# Patient Record
Sex: Female | Born: 1950 | Race: Black or African American | Hispanic: No | Smoking: Current every day smoker
Health system: Southern US, Community
[De-identification: ages and names within clinical notes are randomized; demographics above are authoritative.]

## PROBLEM LIST (undated history)

## (undated) DIAGNOSIS — K219 Gastro-esophageal reflux disease without esophagitis: Secondary | ICD-10-CM

## (undated) DIAGNOSIS — I10 Essential (primary) hypertension: Secondary | ICD-10-CM

## (undated) DIAGNOSIS — F419 Anxiety disorder, unspecified: Secondary | ICD-10-CM

## (undated) DIAGNOSIS — E785 Hyperlipidemia, unspecified: Secondary | ICD-10-CM

## (undated) DIAGNOSIS — F329 Major depressive disorder, single episode, unspecified: Secondary | ICD-10-CM

## (undated) DIAGNOSIS — F32A Depression, unspecified: Secondary | ICD-10-CM

## (undated) HISTORY — PX: TENDON REPAIR: SHX5111

## (undated) HISTORY — DX: Hyperlipidemia, unspecified: E78.5

## (undated) HISTORY — PX: ACHILLES TENDON REPAIR: SUR1153

## (undated) HISTORY — PX: ABDOMINAL HYSTERECTOMY: SHX81

## (undated) HISTORY — PX: TOTAL ABDOMINAL HYSTERECTOMY W/ BILATERAL SALPINGOOPHORECTOMY: SHX83

---

## 2003-10-30 ENCOUNTER — Other Ambulatory Visit: Payer: Self-pay

## 2004-08-26 ENCOUNTER — Other Ambulatory Visit: Payer: Self-pay

## 2004-08-26 ENCOUNTER — Emergency Department: Payer: Self-pay | Admitting: General Practice

## 2004-12-30 ENCOUNTER — Emergency Department: Payer: Self-pay | Admitting: Internal Medicine

## 2005-05-08 ENCOUNTER — Emergency Department: Payer: Self-pay | Admitting: Internal Medicine

## 2005-05-14 ENCOUNTER — Emergency Department: Payer: Self-pay | Admitting: Unknown Physician Specialty

## 2005-05-15 ENCOUNTER — Emergency Department: Payer: Self-pay | Admitting: General Practice

## 2006-03-01 ENCOUNTER — Emergency Department: Payer: Self-pay | Admitting: Internal Medicine

## 2006-05-20 ENCOUNTER — Emergency Department: Payer: Self-pay | Admitting: Emergency Medicine

## 2007-11-24 ENCOUNTER — Ambulatory Visit: Payer: Self-pay | Admitting: Internal Medicine

## 2008-04-27 ENCOUNTER — Emergency Department: Payer: Self-pay | Admitting: Emergency Medicine

## 2008-04-27 ENCOUNTER — Other Ambulatory Visit: Payer: Self-pay

## 2008-12-31 ENCOUNTER — Emergency Department: Payer: Self-pay | Admitting: Emergency Medicine

## 2010-12-10 ENCOUNTER — Emergency Department: Payer: Self-pay | Admitting: Emergency Medicine

## 2012-05-05 ENCOUNTER — Observation Stay: Payer: Self-pay | Admitting: Specialist

## 2012-05-05 LAB — CBC
HCT: 40.1 % (ref 35.0–47.0)
MCH: 31.1 pg (ref 26.0–34.0)
MCV: 95 fL (ref 80–100)
RDW: 14.1 % (ref 11.5–14.5)
WBC: 10.6 10*3/uL (ref 3.6–11.0)

## 2012-05-05 LAB — BASIC METABOLIC PANEL
Anion Gap: 8 (ref 7–16)
BUN: 12 mg/dL (ref 7–18)
Calcium, Total: 9.2 mg/dL (ref 8.5–10.1)
Chloride: 110 mmol/L — ABNORMAL HIGH (ref 98–107)
Co2: 26 mmol/L (ref 21–32)
Creatinine: 0.93 mg/dL (ref 0.60–1.30)
Glucose: 108 mg/dL — ABNORMAL HIGH (ref 65–99)
Potassium: 3.2 mmol/L — ABNORMAL LOW (ref 3.5–5.1)

## 2012-05-05 LAB — TROPONIN I: Troponin-I: 0.02 ng/mL

## 2012-05-05 LAB — CK TOTAL AND CKMB (NOT AT ARMC)
CK, Total: 116 U/L (ref 21–215)
CK-MB: 0.9 ng/mL (ref 0.5–3.6)

## 2012-05-06 LAB — LIPID PANEL
Cholesterol: 218 mg/dL — ABNORMAL HIGH (ref 0–200)
Triglycerides: 75 mg/dL (ref 0–200)
VLDL Cholesterol, Calc: 15 mg/dL (ref 5–40)

## 2012-05-06 LAB — POTASSIUM: Potassium: 3.9 mmol/L (ref 3.5–5.1)

## 2013-04-25 ENCOUNTER — Emergency Department: Payer: Self-pay | Admitting: Emergency Medicine

## 2014-07-20 ENCOUNTER — Emergency Department: Payer: Self-pay | Admitting: Emergency Medicine

## 2014-08-31 ENCOUNTER — Ambulatory Visit: Payer: Self-pay | Admitting: Internal Medicine

## 2014-11-20 DIAGNOSIS — E669 Obesity, unspecified: Secondary | ICD-10-CM | POA: Diagnosis not present

## 2014-11-20 DIAGNOSIS — Z1389 Encounter for screening for other disorder: Secondary | ICD-10-CM | POA: Diagnosis not present

## 2014-11-20 DIAGNOSIS — I10 Essential (primary) hypertension: Secondary | ICD-10-CM | POA: Diagnosis not present

## 2014-12-06 DIAGNOSIS — H3532 Exudative age-related macular degeneration: Secondary | ICD-10-CM | POA: Diagnosis not present

## 2015-01-19 DIAGNOSIS — I10 Essential (primary) hypertension: Secondary | ICD-10-CM | POA: Diagnosis not present

## 2015-01-19 DIAGNOSIS — E669 Obesity, unspecified: Secondary | ICD-10-CM | POA: Diagnosis not present

## 2015-02-28 DIAGNOSIS — H3532 Exudative age-related macular degeneration: Secondary | ICD-10-CM | POA: Diagnosis not present

## 2015-03-04 NOTE — Discharge Summary (Signed)
PATIENT NAME:  Carmen Buckley, Carmen Buckley MR#:  [id number] DATE OF BIRTH:  07/27/1951  DATE OF ADMISSION:  05/05/2012 DATE OF DISCHARGE:  05/06/2012  HISTORY: For a detailed note, please take a look at the history and physical done on admission.   DISCHARGE DIAGNOSES:  1. Chest pain, likely related to anxiety or musculoskeletal in nature.  2. Hypertension, newly diagnosed.  3. Hyperlipidemia.  4. History of tobacco abuse.   DIET: The patient was discharged on a low sodium diet.   ACTIVITY: As tolerated.   FOLLOW-UP: The patient is supposed to get herself a primary care physician and she has insurance.   DISCHARGE MEDICATIONS:  1. Lisinopril 10 mg daily.  2. Pravachol 20 mg daily.  3. Aspirin 81 mg daily.   PERTINENT STUDIES DONE DURING THE HOSPITAL COURSE: Chest x-ray done on admission showing no acute cardiopulmonary disease. A nuclear medicine stress test done the day after admission showing no evidence of myocardial ischemia. Normal LV function, no ischemia.   HOSPITAL COURSE: This is a 64 year old female with medical problems as mentioned above who presented to the hospital with chest pain and palpitations.  1. Chest pain. Given the patient's history of undiagnosed hypertension, ongoing tobacco abuse and some family history, she was brought into the hospital, observed overnight on telemetry, had three sets of cardiac markers checked which were negative. She underwent a stress test the day after admission which showed no evidence of any acute myocardial ischemia. She was, therefore, discharged home on some maintenance medications including aspirin and statin. The patient's total cholesterol was noted to be mildly elevated which is why she was started on a statin.  2. Hypertension. The patient has no previous history of hypertension. When she presented, her systolic blood pressures were over 170 and she had some intermittent stage I hypertension. She was therefore discharged on a low-dose ACE  inhibitor. She was started on a beta blocker while she was in the hospital, but she was noted to be bradycardic. Therefore, I took her off her beta blocker for now.  3. Hypokalemia. This was supplemented and since then has resolved.  4. Tobacco abuse. The patient was strongly advised to quit smoking and placed on a nicotine patch when in the hospital.  5. Hyperlipidemia. As mentioned, the patient's total cholesterol was noted to be as high as 200 with LDL over 150. She was, therefore, started on low dose statin.   CODE STATUS: The patient is a FULL CODE.   TIME SPENT: 40 minutes.   ____________________________ Belia Heman. [name], MD vjs:ap D: 05/07/2012 21:19:41 ET T: 05/07/2012 13:34:53 ET JOB#: [id number]  cc: Belia Heman. [name], MD, <Dictator> Henreitta Leber MD ELECTRONICALLY SIGNED 05/19/2012 12:24

## 2015-03-04 NOTE — H&P (Signed)
PATIENT NAME:  Carmen Buckley, GOAR MR#:  [id number] DATE OF BIRTH:  01-20-1951  DATE OF ADMISSION:  05/05/2012  PRIMARY CARE PHYSICIAN: The patient does not have one.  CHIEF COMPLAINT: Chest pain/pressure and palpitations.   HISTORY OF PRESENT ILLNESS: This is a 64 year old female who presents to the Emergency Room complaining of palpitations and intermittent chest tightness ongoing for the past two days. The patient thought it was related to her reflux, but she tried taking some over-the-counter reflux medications, but they did not alleviate her symptoms. She therefore came to the ER for further evaluation. The patient said that she also developed some shortness of breath and some dizziness along with these symptoms. She has never had these symptoms before. The patient does not have a primary care physician. She does have a strong family history of coronary artery disease and has ongoing tobacco abuse. Hospitalist services were contacted for further treatment and evaluation.   REVIEW OF SYSTEMS: CONSTITUTIONAL: No documented fever. No weight gain or weight loss. EYES: No blurred or double vision. ENT: No tinnitus. No postnasal drip. No redness of the oropharynx. RESPIRATORY: No cough, no wheeze, and no hemoptysis. CARDIOVASCULAR: Positive chest pain. No orthopnea. Positive palpitations. No syncope. GASTROINTESTINAL: No nausea, no vomiting, no diarrhea, no abdominal pain, and no melena or hematochezia. GU: No dysuria or hematuria. ENDOCRINE: No polyuria or nocturia. No heat or cold intolerance. HEMATOLOGIC: No anemia, no bruising, and no bleeding. INTEGUMENTARY: No rashes. No lesions. MUSCULOSKELETAL: No arthritis, no swelling, and no gout. NEUROLOGIC: No numbness, no tingling, no ataxia, and no seizure-type activity. PSYCH: No anxiety, no insomnia, and no ADD.   PAST MEDICAL HISTORY:  1. Macular degeneration. 2. Ongoing tobacco use.   ALLERGIES: No known drug allergies.   SOCIAL HISTORY: She does smoke  a few cigarettes a day and has been smoking for the last 30 to 40 years. No alcohol abuse. No illicit drug abuse. She lives at home by herself.   FAMILY HISTORY: Significant for diabetes on the mother's side of her family. Her mother died from complications of heart failure, father died from pneumonia and respiratory illness.   CURRENT MEDICATIONS: She is currently on no medication.  ADMISSION PHYSICAL EXAMINATION:   VITAL SIGNS: Temperature 98, pulse 68, respirations 18, blood pressure 132/87, and saturation 97% on room air.   GENERAL: The patient is a pleasant appearing female in no apparent distress.   HEENT: Atraumatic, normocephalic. Extraocular muscles are intact. Pupils are equal and reactive to light. Sclerae anicteric. No conjunctival injection. No pharyngeal erythema.   NECK: Supple. No jugular distention. No bruits, no lymphadenopathy, and no thyromegaly.   HEART: Regular rate and rhythm. No murmurs, rubs, or clicks.   LUNGS: Clear to auscultation bilaterally. No rales, no rhonchi, and no wheezes.   ABDOMEN: Soft, flat, nontender, and nondistended. Has good bowel sounds. No hepatosplenomegaly appreciated.   EXTREMITIES: No evidence of any cyanosis, clubbing, or peripheral edema. Has +2 pedal and radial pulses bilaterally.   NEUROLOGICAL: The patient is alert, awake, and oriented x3 with no focal motor or sensory deficits appreciated bilaterally.   SKIN: Moist and warm with no rash appreciated.   LYMPHATIC: There is no cervical or axillary lymphadenopathy.   LABORATORY AND DIAGNOSTICS: Serum glucose 108, BUN 12, creatinine 0.9, sodium 144, potassium 3.2, chloride 110, and bicarbonate 26. LFTs are within normal limits. White cell count is 10.6, hemoglobin 13.2, hematocrit 40.1, and platelet count 201. CK 122 and troponin less than 0.02.   EKG: Normal  sinus rhythm with normal axis and no evidence of any ST or T wave changes.   ASSESSMENT AND PLAN: This is a 64 year old  female with no past medical history who presents to the hospital with palpitations and chest pressure.  1. Chest pain/palpitations. There is some significant concern whether this is an anginal equivalent. The patient does have significant risk factors given ongoing tobacco abuse and strong family history and she has no primary care physician followup. Therefore I will observe her overnight on telemetry, check serial cardiac markers, continue aspirin, low dose beta blockers, nitroglycerin, and oxygen. We will get a Myoview in the morning and follow her clinically.  2. Hypertension. We will start her on low dose metoprolol and monitor.  3. Hypokalemia. I will go ahead and replace her potassium and check a magnesium level and repeat levels in the morning.  4. Tobacco abuse. The patient was strongly advised to quit smoking. If needed we will use a nicotine patch.   CODE STATUS: THE PATIENT IS A FULL CODE.   TIME SPENT: 45 minutes.  ____________________________ Carmen Buckley. Carmen Carmine, MD vjs:slb D: 05/05/2012 09:28:33 ET T: 05/05/2012 09:36:28 ET JOB#: [id number]  cc: Carmen Buckley. Carmen Carmine, MD, <Dictator> Henreitta Leber MD ELECTRONICALLY SIGNED 05/19/2012 12:23

## 2015-03-08 DIAGNOSIS — H1131 Conjunctival hemorrhage, right eye: Secondary | ICD-10-CM | POA: Diagnosis not present

## 2015-03-08 DIAGNOSIS — H3530 Unspecified macular degeneration: Secondary | ICD-10-CM | POA: Diagnosis not present

## 2015-03-08 DIAGNOSIS — I10 Essential (primary) hypertension: Secondary | ICD-10-CM | POA: Diagnosis not present

## 2015-03-26 ENCOUNTER — Emergency Department
Admission: EM | Admit: 2015-03-26 | Discharge: 2015-03-26 | Disposition: A | Discharge status: Home / Self Care | Payer: Medicare Other | Attending: Emergency Medicine | Admitting: Emergency Medicine

## 2015-03-26 ENCOUNTER — Encounter: Payer: Self-pay | Admitting: General Practice

## 2015-03-26 ENCOUNTER — Other Ambulatory Visit: Payer: Self-pay

## 2015-03-26 DIAGNOSIS — I10 Essential (primary) hypertension: Secondary | ICD-10-CM | POA: Diagnosis not present

## 2015-03-26 DIAGNOSIS — R51 Headache: Secondary | ICD-10-CM | POA: Diagnosis not present

## 2015-03-26 DIAGNOSIS — Z720 Tobacco use: Secondary | ICD-10-CM | POA: Diagnosis not present

## 2015-03-26 DIAGNOSIS — R110 Nausea: Secondary | ICD-10-CM | POA: Insufficient documentation

## 2015-03-26 DIAGNOSIS — Z79899 Other long term (current) drug therapy: Secondary | ICD-10-CM | POA: Diagnosis not present

## 2015-03-26 DIAGNOSIS — R197 Diarrhea, unspecified: Secondary | ICD-10-CM | POA: Insufficient documentation

## 2015-03-26 DIAGNOSIS — R42 Dizziness and giddiness: Secondary | ICD-10-CM | POA: Diagnosis not present

## 2015-03-26 DIAGNOSIS — R519 Headache, unspecified: Secondary | ICD-10-CM

## 2015-03-26 DIAGNOSIS — R531 Weakness: Secondary | ICD-10-CM | POA: Diagnosis not present

## 2015-03-26 HISTORY — DX: Essential (primary) hypertension: I10

## 2015-03-26 LAB — CBC
HCT: 43.3 % (ref 35.0–47.0)
Hemoglobin: 14.3 g/dL (ref 12.0–16.0)
MCH: 31.2 pg (ref 26.0–34.0)
MCHC: 33.1 g/dL (ref 32.0–36.0)
MCV: 94.4 fL (ref 80.0–100.0)
PLATELETS: 237 10*3/uL (ref 150–440)
RBC: 4.59 MIL/uL (ref 3.80–5.20)
RDW: 14.3 % (ref 11.5–14.5)
WBC: 11.3 10*3/uL — AB (ref 3.6–11.0)

## 2015-03-26 LAB — URINALYSIS COMPLETE WITH MICROSCOPIC (ARMC ONLY)
BACTERIA UA: NONE SEEN
Bilirubin Urine: NEGATIVE
Glucose, UA: NEGATIVE mg/dL
HGB URINE DIPSTICK: NEGATIVE
Ketones, ur: NEGATIVE mg/dL
LEUKOCYTES UA: NEGATIVE
NITRITE: NEGATIVE
PH: 5 (ref 5.0–8.0)
Protein, ur: NEGATIVE mg/dL
Specific Gravity, Urine: 1.018 (ref 1.005–1.030)

## 2015-03-26 LAB — BASIC METABOLIC PANEL
ANION GAP: 9 (ref 5–15)
BUN: 12 mg/dL (ref 6–20)
CO2: 25 mmol/L (ref 22–32)
Calcium: 9.5 mg/dL (ref 8.9–10.3)
Chloride: 106 mmol/L (ref 101–111)
Creatinine, Ser: 0.92 mg/dL (ref 0.44–1.00)
GFR calc Af Amer: >60 mL/min (ref >60)
GFR calc non Af Amer: >60 mL/min (ref >60)
GLUCOSE: 94 mg/dL (ref 65–99)
Potassium: 3.3 mmol/L — ABNORMAL LOW (ref 3.5–5.1)
Sodium: 140 mmol/L (ref 135–145)

## 2015-03-26 LAB — TROPONIN I: Troponin I: <0.03 ng/mL (ref <0.031)

## 2015-03-26 MED ORDER — PROCHLORPERAZINE MALEATE 10 MG PO TABS: 10.0000 mg | ORAL_TABLET | Freq: Four times a day (QID) | ORAL | Status: DC | PRN

## 2015-03-26 MED ORDER — PROCHLORPERAZINE MALEATE 10 MG PO TABS
10.0000 mg | ORAL_TABLET | Freq: Once | ORAL | Status: AC
  Administered 2015-03-26: 10 mg via ORAL
  Filled 2015-03-26: qty 1

## 2015-03-26 NOTE — Discharge Instructions (Signed)
Please seek medical attention for any high fevers, chest pain, shortness of breath, change in behavior, persistent vomiting, bloody stool or any other new or concerning symptoms. ° °Headaches, Frequently Asked Questions °MIGRAINE HEADACHES °Q: What is migraine? What causes it? How can I treat it? °A: Generally, migraine headaches begin as a dull ache. Then they develop into a constant, throbbing, and pulsating pain. You may experience pain at the temples. You may experience pain at the front or back of one or both sides of the head. The pain is usually accompanied by a combination of: °· Nausea. °· Vomiting. °· Sensitivity to light and noise. °Some people (about 15%) experience an aura (see below) before an attack. The cause of migraine is believed to be chemical reactions in the brain. Treatment for migraine may include over-the-counter or prescription medications. It may also include self-help techniques. These include relaxation training and biofeedback.  °Q: What is an aura? °A: About 15% of people with migraine get an "aura". This is a sign of neurological symptoms that occur before a migraine headache. You may see wavy or jagged lines, dots, or flashing lights. You might experience tunnel vision or blind spots in one or both eyes. The aura can include visual or auditory hallucinations (something imagined). It may include disruptions in smell (such as strange odors), taste or touch. Other symptoms include: °· Numbness. °· A "pins and needles" sensation. °· Difficulty in recalling or speaking the correct word. °These neurological events may last as long as 60 minutes. These symptoms will fade as the headache begins. °Q: What is a trigger? °A: Certain physical or environmental factors can lead to or "trigger" a migraine. These include: °· Foods. °· Hormonal changes. °· Weather. °· Stress. °It is important to remember that triggers are different for everyone. To help prevent migraine attacks, you need to figure  out which triggers affect you. Keep a headache diary. This is a good way to track triggers. The diary will help you talk to your healthcare professional about your condition. °Q: Does weather affect migraines? °A: Bright sunshine, hot, humid conditions, and drastic changes in barometric pressure may lead to, or "trigger," a migraine attack in some people. But studies have shown that weather does not act as a trigger for everyone with migraines. °Q: What is the link between migraine and hormones? °A: Hormones start and regulate many of your body's functions. Hormones keep your body in balance within a constantly changing environment. The levels of hormones in your body are unbalanced at times. Examples are during menstruation, pregnancy, or menopause. That can lead to a migraine attack. In fact, about three quarters of all women with migraine report that their attacks are related to the menstrual cycle.  °Q: Is there an increased risk of stroke for migraine sufferers? °A: The likelihood of a migraine attack causing a stroke is very remote. That is not to say that migraine sufferers cannot have a stroke associated with their migraines. In persons under age 40, the most common associated factor for stroke is migraine headache. But over the course of a person's normal life span, the occurrence of migraine headache may actually be associated with a reduced risk of dying from cerebrovascular disease due to stroke.  °Q: What are acute medications for migraine? °A: Acute medications are used to treat the pain of the headache after it has started. Examples over-the-counter medications, NSAIDs, ergots, and triptans.  °Q: What are the triptans? °A: Triptans are the newest class of abortive medications.   They are specifically targeted to treat migraine. Triptans are vasoconstrictors. They moderate some chemical reactions in the brain. The triptans work on receptors in your brain. Triptans help to restore the balance of a  neurotransmitter called serotonin. Fluctuations in levels of serotonin are thought to be a main cause of migraine.  °Q: Are over-the-counter medications for migraine effective? °A: Over-the-counter, or "OTC," medications may be effective in relieving mild to moderate pain and associated symptoms of migraine. But you should see your caregiver before beginning any treatment regimen for migraine.  °Q: What are preventive medications for migraine? °A: Preventive medications for migraine are sometimes referred to as "prophylactic" treatments. They are used to reduce the frequency, severity, and length of migraine attacks. Examples of preventive medications include antiepileptic medications, antidepressants, beta-blockers, calcium channel blockers, and NSAIDs (nonsteroidal anti-inflammatory drugs). °Q: Why are anticonvulsants used to treat migraine? °A: During the past few years, there has been an increased interest in antiepileptic drugs for the prevention of migraine. They are sometimes referred to as "anticonvulsants". Both epilepsy and migraine may be caused by similar reactions in the brain.  °Q: Why are antidepressants used to treat migraine? °A: Antidepressants are typically used to treat people with depression. They may reduce migraine frequency by regulating chemical levels, such as serotonin, in the brain.  °Q: What alternative therapies are used to treat migraine? °A: The term "alternative therapies" is often used to describe treatments considered outside the scope of conventional Western medicine. Examples of alternative therapy include acupuncture, acupressure, and yoga. Another common alternative treatment is herbal therapy. Some herbs are believed to relieve headache pain. Always discuss alternative therapies with your caregiver before proceeding. Some herbal products contain arsenic and other toxins. °TENSION HEADACHES °Q: What is a tension-type headache? What causes it? How can I treat it? °A:  Tension-type headaches occur randomly. They are often the result of temporary stress, anxiety, fatigue, or anger. Symptoms include soreness in your temples, a tightening band-like sensation around your head (a "vice-like" ache). Symptoms can also include a pulling feeling, pressure sensations, and contracting head and neck muscles. The headache begins in your forehead, temples, or the back of your head and neck. Treatment for tension-type headache may include over-the-counter or prescription medications. Treatment may also include self-help techniques such as relaxation training and biofeedback. °CLUSTER HEADACHES °Q: What is a cluster headache? What causes it? How can I treat it? °A: Cluster headache gets its name because the attacks come in groups. The pain arrives with little, if any, warning. It is usually on one side of the head. A tearing or bloodshot eye and a runny nose on the same side of the headache may also accompany the pain. Cluster headaches are believed to be caused by chemical reactions in the brain. They have been described as the most severe and intense of any headache type. Treatment for cluster headache includes prescription medication and oxygen. °SINUS HEADACHES °Q: What is a sinus headache? What causes it? How can I treat it? °A: When a cavity in the bones of the face and skull (a sinus) becomes inflamed, the inflammation will cause localized pain. This condition is usually the result of an allergic reaction, a tumor, or an infection. If your headache is caused by a sinus blockage, such as an infection, you will probably have a fever. An x-ray will confirm a sinus blockage. Your caregiver's treatment might include antibiotics for the infection, as well as antihistamines or decongestants.  °REBOUND HEADACHES °Q: What is a rebound   headache? What causes it? How can I treat it? A: A pattern of taking acute headache medications too often can lead to a condition known as "rebound headache." A  pattern of taking too much headache medication includes taking it more than 2 days per week or in excessive amounts. That means more than the label or a caregiver advises. With rebound headaches, your medications not only stop relieving pain, they actually begin to cause headaches. Doctors treat rebound headache by tapering the medication that is being overused. Sometimes your caregiver will gradually substitute a different type of treatment or medication. Stopping may be a challenge. Regularly overusing a medication increases the potential for serious side effects. Consult a caregiver if you regularly use headache medications more than 2 days per week or more than the label advises. ADDITIONAL QUESTIONS AND ANSWERS Q: What is biofeedback? A: Biofeedback is a self-help treatment. Biofeedback uses special equipment to monitor your body's involuntary physical responses. Biofeedback monitors:  Breathing.  Pulse.  Heart rate.  Temperature.  Muscle tension.  Brain activity. Biofeedback helps you refine and perfect your relaxation exercises. You learn to control the physical responses that are related to stress. Once the technique has been mastered, you do not need the equipment any more. Q: Are headaches hereditary? A: Four out of five (80%) of people that suffer report a family history of migraine. Scientists are not sure if this is genetic or a family predisposition. Despite the uncertainty, a child has a 50% chance of having migraine if one parent suffers. The child has a 75% chance if both parents suffer.  Q: Can children get headaches? A: By the time they reach high school, most young people have experienced some type of headache. Many safe and effective approaches or medications can prevent a headache from occurring or stop it after it has begun.  Q: What type of doctor should I see to diagnose and treat my headache? A: Start with your primary caregiver. Discuss his or her experience and  approach to headaches. Discuss methods of classification, diagnosis, and treatment. Your caregiver may decide to recommend you to a headache specialist, depending upon your symptoms or other physical conditions. Having diabetes, allergies, etc., may require a more comprehensive and inclusive approach to your headache. The National [employer] Foundation will provide, upon request, a list of [facility] physician members in your state. Document Released: 01/17/2004 Document Revised: 01/19/2012 Document Reviewed: 06/26/2008 [facility] Patient Information 2015 [place], [place]. This information is not intended to replace advice given to you by your health care provider. Make sure you discuss any questions you have with your health care provider.  Nausea, Adult Nausea means you feel sick to your stomach or need to throw up (vomit). It may be a sign of a more serious problem. If nausea gets worse, you may throw up. If you throw up a lot, you may lose too much body fluid (dehydration). HOME CARE   Get plenty of rest.  Ask your doctor how to replace body fluid losses (rehydrate).  Eat small amounts of food. Sip liquids more often.  Take all medicines as told by your doctor. GET HELP RIGHT AWAY IF:  You have a fever.  You pass out (faint).  You keep throwing up or have blood in your throw up.  You are very weak, have dry lips or a dry mouth, or you are very thirsty (dehydrated).  You have dark or bloody poop (stool).  You have very bad chest or belly (abdominal) pain.  You do not get better after 2 days, or you get worse.  You have a headache. MAKE SURE YOU:  Understand these instructions.  Will watch your condition.  Will get help right away if you are not doing well or get worse. Document Released: 10/16/2011 Document Revised: 01/19/2012 Document Reviewed: 10/16/2011 [facility] Patient Information 2015 [place], [place]. This information is not intended to replace advice given to you by your health  care provider. Make sure you discuss any questions you have with your health care provider.

## 2015-03-26 NOTE — ED Provider Notes (Signed)
[facility] Emergency Department Provider Note   ____________________________________________  Time seen: 2050  I have reviewed the triage vital signs and the nursing notes.   HISTORY  Chief Complaint Diarrhea and Dizziness   History limited by: Not Limited   HPI Carmen Buckley is a 64 y.o. female presents to the emergency department because of concerns for dizziness. The patient states that she first became lightheaded 2 days ago. She has had a surprise birthday party when she started feeling sick. She thinks she might have passed out during the party. She did subsequently have multiple episodes of emesis as well as diarrhea. She denies any blood in either. Since that time she has felt slightly sick to her stomach as well as dizzy. To her the dizziness is represented by a lightheaded swimming feeling in her brain. The patient thinks she has had intermittent fever since that time.     Past Medical History  Diagnosis Date  . Hypertension     There are no active problems to display for this patient.   Past Surgical History  Procedure Laterality Date  . Abdominal hysterectomy      Current Outpatient Rx  Name  Route  Sig  Dispense  Refill  . atorvastatin (LIPITOR) 10 MG tablet   Oral   Take 10 mg by mouth daily.         . metoprolol (LOPRESSOR) 50 MG tablet   Oral   Take 50 mg by mouth daily.         Marland Kitchen omeprazole (PRILOSEC) 20 MG capsule   Oral   Take 20 mg by mouth daily.         Marland Kitchen PRESCRIPTION MEDICATION      Patient just began a new blood pressure medication on Wednesday. States it is a white pill (perhaps Prinivil)?         . prochlorperazine (COMPAZINE) 10 MG tablet   Oral   Take 1 tablet (10 mg total) by mouth every 6 (six) hours as needed for nausea.   20 tablet   0     Allergies Review of patient's allergies indicates no known allergies.  No family history on file.  Social History History  Substance Use Topics   . Smoking status: Current Some Day Smoker -- 0.50 packs/day    Types: Cigarettes  . Smokeless tobacco: Never Used  . Alcohol Use: No    Review of Systems  Constitutional: Intermittent low-grade subjective fever Cardiovascular: Negative for chest pain. Respiratory: Negative for shortness of breath. Gastrointestinal: Negative for abdominal pain, vomiting and diarrhea. Genitourinary: Negative for dysuria. Musculoskeletal: Negative for back pain. Skin: Negative for rash. Neurological: Mild headache, dizziness   10-point ROS otherwise negative.  ____________________________________________   PHYSICAL EXAM:  VITAL SIGNS: ED Triage Vitals  Enc Vitals Group     BP 03/26/15 1611 176/92 mmHg     Pulse Rate 03/26/15 1611 72     Resp 03/26/15 1611 18     Temp 03/26/15 1611 98.2 F (36.8 C)     Temp Source 03/26/15 1611 Oral     SpO2 03/26/15 1611 97 %     Weight 03/26/15 1611 165 lb (74.844 kg)     Height 03/26/15 1611 5' (1.524 m)     Head Cir --      Peak Flow --      Pain Score 03/26/15 1612 7   Constitutional: Alert and oriented. Well appearing and in no distress. Eyes: Conjunctivae are normal. PERRL.  Normal extraocular movements. No nystagmus. ENT   Head: Normocephalic and atraumatic.   Nose: No congestion/rhinnorhea.   Mouth/Throat: Mucous membranes are moist.   Neck: No stridor. Hematological/Lymphatic/Immunilogical: No cervical lymphadenopathy. Cardiovascular: Normal rate, regular rhythm.  No murmurs, rubs, or gallops. Respiratory: Normal respiratory effort without tachypnea nor retractions. Breath sounds are clear and equal bilaterally. No wheezes/rales/rhonchi. Gastrointestinal: Soft and nontender. No distention.  Genitourinary: Deferred Musculoskeletal: Normal range of motion in all extremities. No joint effusions.  No lower extremity tenderness nor edema. Neurologic:  Normal speech and language. No gross focal neurologic deficits are appreciated.  Speech is normal. Gait normal. Finger to nose normal. Skin:  Skin is warm, dry and intact. No rash noted. Psychiatric: Mood and affect are normal. Speech and behavior are normal. Patient exhibits appropriate insight and judgment.  ____________________________________________    LABS (pertinent positives/negatives)  Labs Reviewed  CBC - Abnormal; Notable for the following:    WBC 11.3 (*)    All other components within normal limits  BASIC METABOLIC PANEL - Abnormal; Notable for the following:    Potassium 3.3 (*)    All other components within normal limits  URINALYSIS COMPLETEWITH MICROSCOPIC ([facility])  - Abnormal; Notable for the following:    Color, Urine YELLOW (*)    APPearance CLEAR (*)    Squamous Epithelial / LPF 0-5 (*)    All other components within normal limits  TROPONIN I     ____________________________________________   EKG  EKG Time: 1622 Rate: 62 Rhythm: normal sinus rhythm Axis: normal Intervals: normal QRS: normal ST changes: no st elevation   ____________________________________________    RADIOLOGY  None  ____________________________________________   PROCEDURES  Procedure(s) performed: None  Critical Care performed: No  ____________________________________________   INITIAL IMPRESSION / ASSESSMENT AND PLAN / ED COURSE  Pertinent labs & imaging results that were available during my care of the patient were reviewed by me and considered in my medical decision making (see chart for details).  Patient here with dizziness and lightheadedness for 2 days. Physical exam without any concerning findings. No focal neuro deficits. Additionally blood work with just some mild leukocytosis. Will check urine. Additionally will give Compazine to see if that helps with the dizziness and nausea.  ----------------------------------------- 10:51 PM on 03/26/2015 -----------------------------------------  She does state she feels better after  medication. Discussed return precautions with patient. She has an appointment with her primary care doctor already scheduled for tomorrow. Encouraged her to keep that appointment.  ____________________________________________   FINAL CLINICAL IMPRESSION(S) / ED DIAGNOSES  Final diagnoses:  Headache, unspecified headache type  Nausea     Nance Pear, MD 03/26/15 2252

## 2015-03-26 NOTE — ED Notes (Signed)
Pt. Arrived to ED from home with reports of experiencing dizziness, nausea and vomiting and loose stools since Saturday. PT reports that she is also experiencing a mild headache. Pt states "i was just started on a new blood pressure medications two weeks ago, and i felt like it all started after that".  Pt. Reports "i just do not feel right".  Alert and Oriented.

## 2015-04-02 DIAGNOSIS — E669 Obesity, unspecified: Secondary | ICD-10-CM | POA: Diagnosis not present

## 2015-04-02 DIAGNOSIS — I10 Essential (primary) hypertension: Secondary | ICD-10-CM | POA: Diagnosis not present

## 2015-05-04 DIAGNOSIS — H3531 Nonexudative age-related macular degeneration: Secondary | ICD-10-CM | POA: Diagnosis not present

## 2015-05-04 DIAGNOSIS — H3532 Exudative age-related macular degeneration: Secondary | ICD-10-CM | POA: Diagnosis not present

## 2015-05-24 DIAGNOSIS — L853 Xerosis cutis: Secondary | ICD-10-CM | POA: Diagnosis not present

## 2015-05-24 DIAGNOSIS — R5381 Other malaise: Secondary | ICD-10-CM | POA: Diagnosis not present

## 2015-05-24 DIAGNOSIS — Z0000 Encounter for general adult medical examination without abnormal findings: Secondary | ICD-10-CM | POA: Diagnosis not present

## 2015-05-24 DIAGNOSIS — E860 Dehydration: Secondary | ICD-10-CM | POA: Diagnosis not present

## 2015-07-11 DIAGNOSIS — H3532 Exudative age-related macular degeneration: Secondary | ICD-10-CM | POA: Diagnosis not present

## 2015-09-17 DIAGNOSIS — H353212 Exudative age-related macular degeneration, right eye, with inactive choroidal neovascularization: Secondary | ICD-10-CM | POA: Diagnosis not present

## 2015-12-04 DIAGNOSIS — H43811 Vitreous degeneration, right eye: Secondary | ICD-10-CM | POA: Diagnosis not present

## 2015-12-17 DIAGNOSIS — H353122 Nonexudative age-related macular degeneration, left eye, intermediate dry stage: Secondary | ICD-10-CM | POA: Diagnosis not present

## 2015-12-17 DIAGNOSIS — H4311 Vitreous hemorrhage, right eye: Secondary | ICD-10-CM | POA: Diagnosis not present

## 2015-12-17 DIAGNOSIS — H353212 Exudative age-related macular degeneration, right eye, with inactive choroidal neovascularization: Secondary | ICD-10-CM | POA: Diagnosis not present

## 2016-03-03 DIAGNOSIS — H43811 Vitreous degeneration, right eye: Secondary | ICD-10-CM | POA: Diagnosis not present

## 2016-03-03 DIAGNOSIS — H353212 Exudative age-related macular degeneration, right eye, with inactive choroidal neovascularization: Secondary | ICD-10-CM | POA: Diagnosis not present

## 2016-03-12 IMAGING — RF DG UGI W/ KUB
4 of 5 series · 14 of 24 positions shown · non-contrast
Comparison: None.

CLINICAL DATA: Abdominal pain, morning sickness since 1778

EXAM:
UPPER GI SERIES WITHOUT KUB
TECHNIQUE: Routine upper GI series was performed with thin and thick barium.
FLUOROSCOPY TIME:  1 min

[Series 1: fluoro_iodine_singleshot_bw · 0.17mm/px · 9 of 19 slices shown]
[im 1/19]
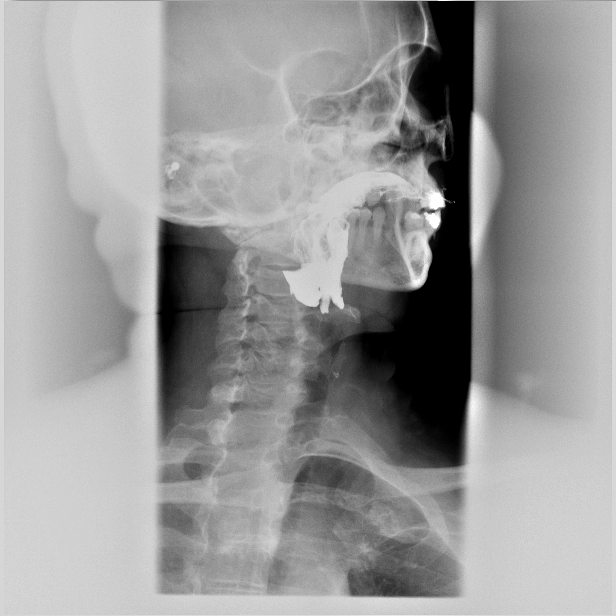
[im 3/19]
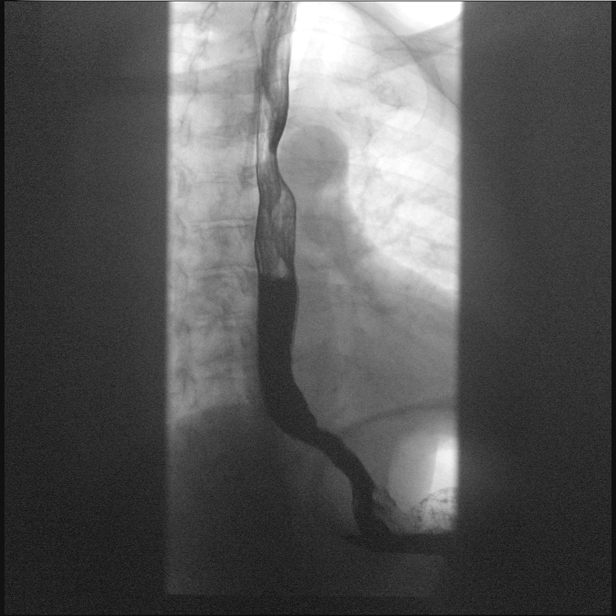
[im 6/19]
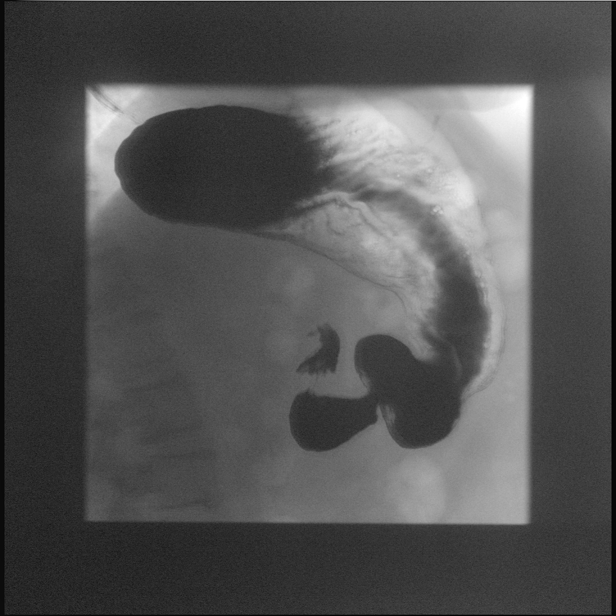
[im 8/19]
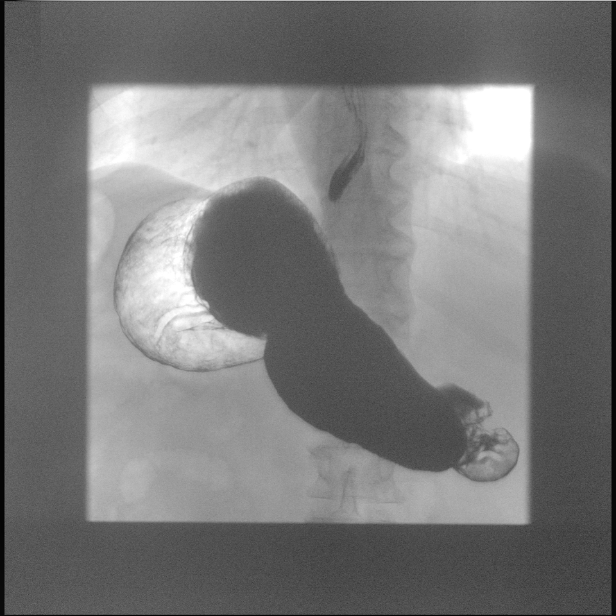
[im 9/19]
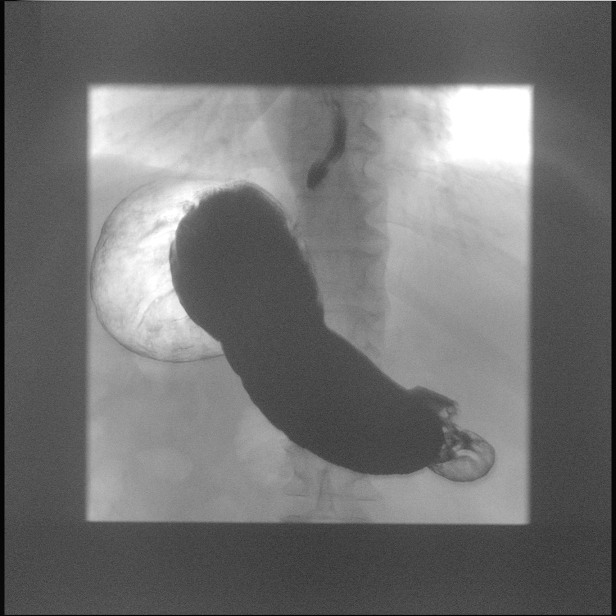
[im 12/19]
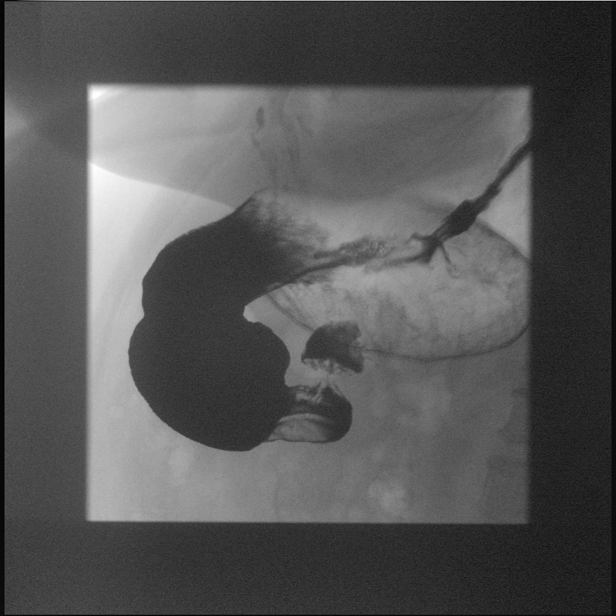
[im 14/19]
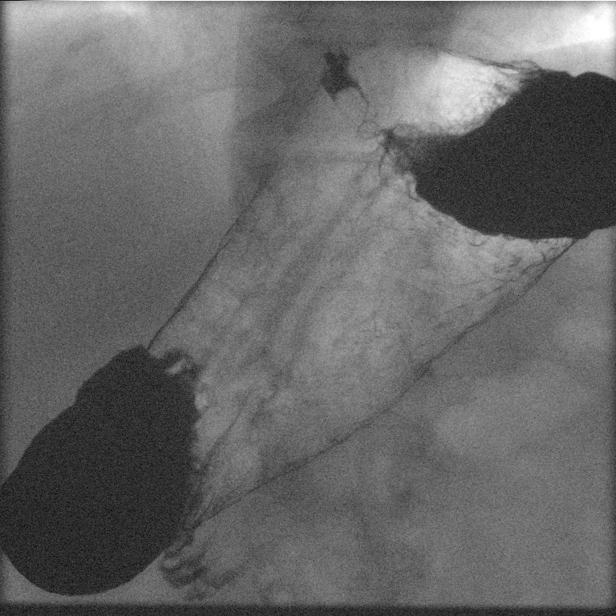
[im 15/19]
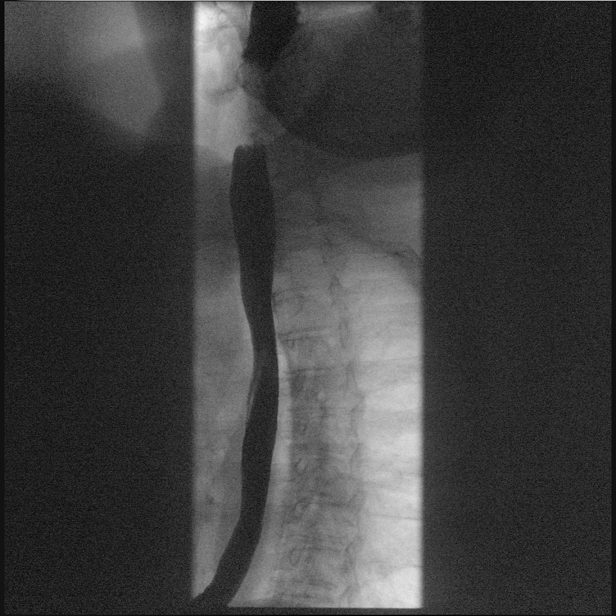
[im 17/19]
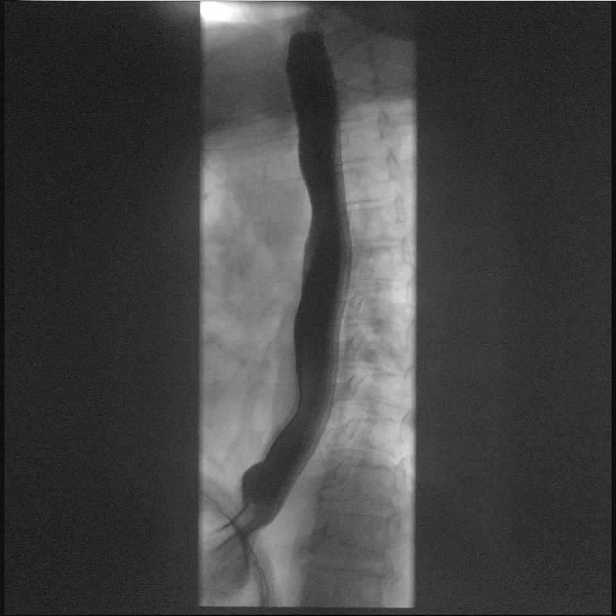

[Series 2: fluoro_iodine 2fps_bw · 0.17mm/px · 2 of 9 frames shown (1 of 3)]
[frame 2/9]
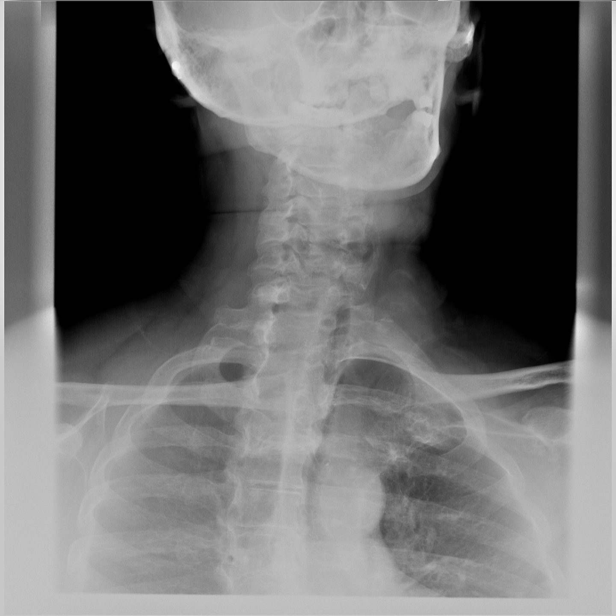
[frame 8/9]
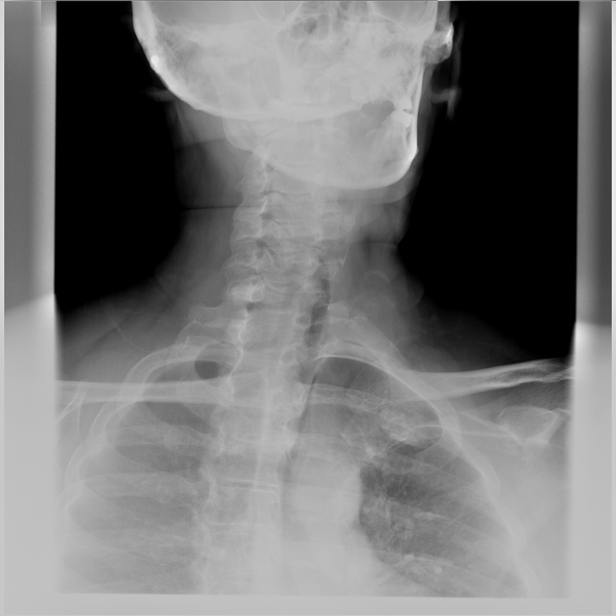

[Series 3: fluoro_iodine 2fps_bw · 0.17mm/px · 2 of 15 frames shown (2 of 3)]
[frame 3/15]
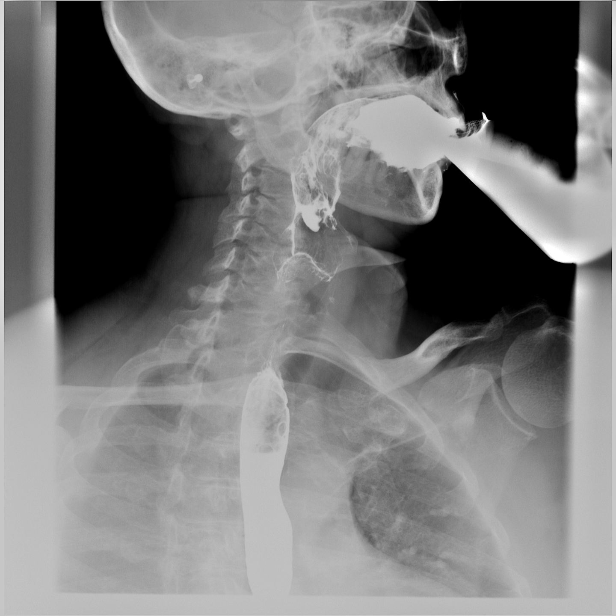
[frame 13/15]
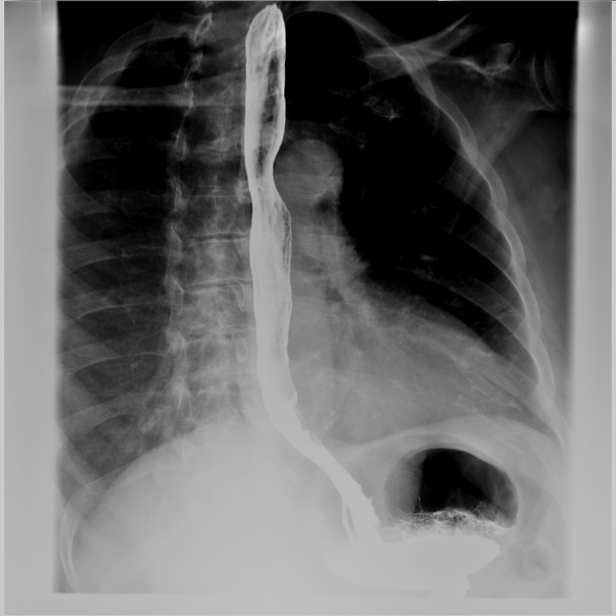

[Series 17: fluoro_iodine 2fps_bw · 0.18mm/px · 1 of 1 slices shown (3 of 3)]
[im 1/1]
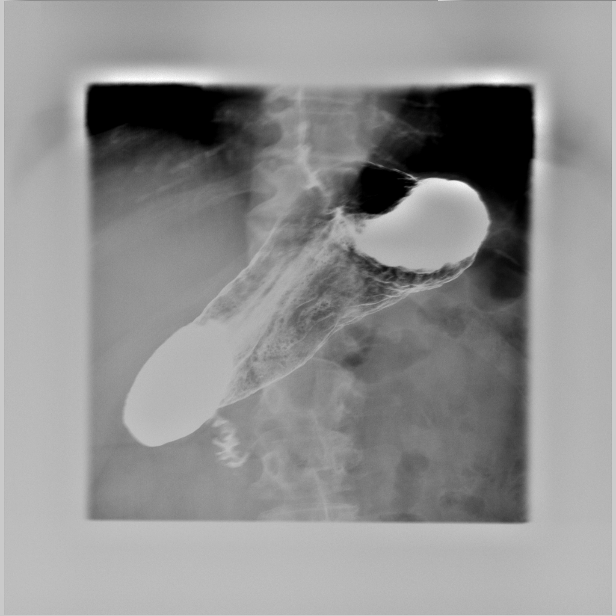

[14 of 24 positions shown; findings below may reference images not displayed]

FINDINGS: Examination of the esophagus demonstrated normal esophageal
motility. Normal esophageal morphology without evidence of
esophagitis or ulceration. No esophageal stricture, diverticula, or
mass lesion. There is mild gastroesophageal reflux. There is no
hiatal hernia. There is no spontaneous or inducible gastroesophageal
reflux.

Examination of the stomach demonstrated normal rugal folds and areae
gastricae. The gastric mucosa appeared unremarkable without evidence
of ulceration, scarring, or mass lesion. Gastric motility and
emptying was normal. Fluoroscopic examination of the duodenum
demonstrates normal motility and morphology without evidence of
ulceration or mass lesion.

At the end of the examination a 12.5mm barium tablet was
administered which transited through the esophagus and
esophagogastric junction without delay.
IMPRESSION: 1. Mild gastroesophageal reflux.

## 2016-06-09 DIAGNOSIS — H353212 Exudative age-related macular degeneration, right eye, with inactive choroidal neovascularization: Secondary | ICD-10-CM | POA: Diagnosis not present

## 2016-06-09 DIAGNOSIS — H2513 Age-related nuclear cataract, bilateral: Secondary | ICD-10-CM | POA: Diagnosis not present

## 2016-06-11 ENCOUNTER — Other Ambulatory Visit: Payer: Self-pay | Admitting: Internal Medicine

## 2016-06-11 DIAGNOSIS — I10 Essential (primary) hypertension: Secondary | ICD-10-CM | POA: Diagnosis not present

## 2016-06-11 DIAGNOSIS — Z1231 Encounter for screening mammogram for malignant neoplasm of breast: Secondary | ICD-10-CM

## 2016-06-11 DIAGNOSIS — R110 Nausea: Secondary | ICD-10-CM | POA: Diagnosis not present

## 2016-06-30 ENCOUNTER — Ambulatory Visit
Admission: RE | Admit: 2016-06-30 | Discharge: 2016-06-30 | Disposition: A | Discharge status: Home / Self Care | Payer: Medicare Other | Source: Ambulatory Visit | Attending: Internal Medicine | Admitting: Internal Medicine

## 2016-06-30 ENCOUNTER — Other Ambulatory Visit: Payer: Self-pay | Admitting: Internal Medicine

## 2016-06-30 DIAGNOSIS — Z1231 Encounter for screening mammogram for malignant neoplasm of breast: Secondary | ICD-10-CM

## 2016-08-11 DIAGNOSIS — H353212 Exudative age-related macular degeneration, right eye, with inactive choroidal neovascularization: Secondary | ICD-10-CM | POA: Diagnosis not present

## 2017-04-30 DIAGNOSIS — H2512 Age-related nuclear cataract, left eye: Secondary | ICD-10-CM | POA: Diagnosis not present

## 2017-05-12 DIAGNOSIS — H2511 Age-related nuclear cataract, right eye: Secondary | ICD-10-CM | POA: Diagnosis not present

## 2017-05-14 ENCOUNTER — Encounter: Payer: Self-pay | Admitting: *Deleted

## 2017-05-26 ENCOUNTER — Ambulatory Visit: Payer: Medicare Other | Admitting: Anesthesiology

## 2017-05-26 ENCOUNTER — Ambulatory Visit
Admission: RE | Admit: 2017-05-26 | Discharge: 2017-05-26 | Disposition: A | Discharge status: Home / Self Care | Payer: Medicare Other | Source: Ambulatory Visit | Attending: Ophthalmology | Admitting: Ophthalmology

## 2017-05-26 ENCOUNTER — Encounter
Admission: RE | Disposition: A | Discharge status: Home / Self Care | Payer: Self-pay | Source: Ambulatory Visit | Attending: Ophthalmology

## 2017-05-26 ENCOUNTER — Encounter: Payer: Self-pay | Admitting: *Deleted

## 2017-05-26 DIAGNOSIS — F329 Major depressive disorder, single episode, unspecified: Secondary | ICD-10-CM | POA: Insufficient documentation

## 2017-05-26 DIAGNOSIS — I10 Essential (primary) hypertension: Secondary | ICD-10-CM | POA: Insufficient documentation

## 2017-05-26 DIAGNOSIS — Z90710 Acquired absence of both cervix and uterus: Secondary | ICD-10-CM | POA: Insufficient documentation

## 2017-05-26 DIAGNOSIS — K219 Gastro-esophageal reflux disease without esophagitis: Secondary | ICD-10-CM | POA: Diagnosis not present

## 2017-05-26 DIAGNOSIS — Z79899 Other long term (current) drug therapy: Secondary | ICD-10-CM | POA: Insufficient documentation

## 2017-05-26 DIAGNOSIS — H2511 Age-related nuclear cataract, right eye: Secondary | ICD-10-CM | POA: Diagnosis not present

## 2017-05-26 DIAGNOSIS — F419 Anxiety disorder, unspecified: Secondary | ICD-10-CM | POA: Insufficient documentation

## 2017-05-26 DIAGNOSIS — E7800 Pure hypercholesterolemia, unspecified: Secondary | ICD-10-CM | POA: Insufficient documentation

## 2017-05-26 HISTORY — DX: Major depressive disorder, single episode, unspecified: F32.9

## 2017-05-26 HISTORY — DX: Depression, unspecified: F32.A

## 2017-05-26 HISTORY — DX: Anxiety disorder, unspecified: F41.9

## 2017-05-26 HISTORY — PX: CATARACT EXTRACTION W/PHACO: SHX586

## 2017-05-26 HISTORY — DX: Gastro-esophageal reflux disease without esophagitis: K21.9

## 2017-05-26 SURGERY — PHACOEMULSIFICATION, CATARACT, WITH IOL INSERTION
Anesthesia: Monitor Anesthesia Care | Site: Eye | Laterality: Right | Wound class: Clean

## 2017-05-26 MED ORDER — ARMC OPHTHALMIC DILATING DROPS
1.0000 "application " | OPHTHALMIC | Status: AC
  Administered 2017-05-26 (×2): 1 via OPHTHALMIC

## 2017-05-26 MED ORDER — EPINEPHRINE PF 1 MG/ML IJ SOLN
INTRAOCULAR | Status: DC | PRN
  Administered 2017-05-26: 1 mL via OPHTHALMIC

## 2017-05-26 MED ORDER — NA CHONDROIT SULF-NA HYALURON 40-17 MG/ML IO SOLN
INTRAOCULAR | Status: DC | PRN
  Administered 2017-05-26: 1 mL via INTRAOCULAR

## 2017-05-26 MED ORDER — MOXIFLOXACIN HCL 0.5 % OP SOLN
OPHTHALMIC | Status: DC | PRN
  Administered 2017-05-26: .2 mL via OPHTHALMIC

## 2017-05-26 MED ORDER — CARBACHOL 0.01 % IO SOLN
INTRAOCULAR | Status: DC | PRN
  Administered 2017-05-26: .5 mL via INTRAOCULAR

## 2017-05-26 MED ORDER — ARMC OPHTHALMIC DILATING DROPS
OPHTHALMIC | Status: AC
  Filled 2017-05-26: qty 0.4

## 2017-05-26 MED ORDER — MOXIFLOXACIN HCL 0.5 % OP SOLN
OPHTHALMIC | Status: AC
  Filled 2017-05-26: qty 3

## 2017-05-26 MED ORDER — SODIUM CHLORIDE 0.9 % IV SOLN
INTRAVENOUS | Status: DC
  Administered 2017-05-26 (×2): via INTRAVENOUS

## 2017-05-26 MED ORDER — MIDAZOLAM HCL 2 MG/2ML IJ SOLN
INTRAMUSCULAR | Status: AC
  Filled 2017-05-26: qty 2

## 2017-05-26 MED ORDER — ALFENTANIL 500 MCG/ML IJ INJ
INJECTION | INTRAVENOUS | Status: AC
  Filled 2017-05-26: qty 5

## 2017-05-26 MED ORDER — LIDOCAINE HCL (PF) 4 % IJ SOLN
INTRAOCULAR | Status: DC | PRN
  Administered 2017-05-26: 2 mL via OPHTHALMIC

## 2017-05-26 MED ORDER — POVIDONE-IODINE 5 % OP SOLN
OPHTHALMIC | Status: DC | PRN
  Administered 2017-05-26: 1 via OPHTHALMIC

## 2017-05-26 MED ORDER — MIDAZOLAM HCL 2 MG/2ML IJ SOLN
INTRAMUSCULAR | Status: DC | PRN
  Administered 2017-05-26 (×2): 0.5 mg via INTRAVENOUS

## 2017-05-26 MED ORDER — MOXIFLOXACIN HCL 0.5 % OP SOLN: 1.0000 [drp] | OPHTHALMIC | Status: DC | PRN

## 2017-05-26 SURGICAL SUPPLY — 16 items

## 2017-05-26 NOTE — Anesthesia Preprocedure Evaluation (Signed)
Anesthesia Evaluation  Patient identified by MRN, date of birth, ID band Patient awake    Reviewed: Allergy & Precautions, H&P , NPO status , Patient's Chart, lab work & pertinent test results, reviewed documented beta blocker date and time   History of Anesthesia Complications Negative for: history of anesthetic complications  Airway Mallampati: II  TM Distance: >3 FB Neck ROM: full    Dental  (+) Dental Advidsory Given, Missing, Poor Dentition   Pulmonary neg shortness of breath, neg COPD, neg recent URI, Current Smoker,           Cardiovascular Exercise Tolerance: Good hypertension, (-) angina(-) CAD, (-) Past MI, (-) Cardiac Stents and (-) CABG (-) dysrhythmias (-) Valvular Problems/Murmurs     Neuro/Psych PSYCHIATRIC DISORDERS (Anxiety and depression) negative neurological ROS     GI/Hepatic Neg liver ROS, GERD  ,  Endo/Other  negative endocrine ROS  Renal/GU negative Renal ROS  negative genitourinary   Musculoskeletal   Abdominal   Peds  Hematology negative hematology ROS (+)   Anesthesia Other Findings Past Medical History: No date: Anxiety No date: Depression No date: GERD (gastroesophageal reflux disease) No date: Hypertension   Reproductive/Obstetrics negative OB ROS                             Anesthesia Physical Anesthesia Plan  ASA: II  Anesthesia Plan: MAC   Post-op Pain Management:    Induction:   PONV Risk Score and Plan: 1 and Ondansetron and Dexamethasone  Airway Management Planned:   Additional Equipment:   Intra-op Plan:   Post-operative Plan:   Informed Consent: I have reviewed the patients History and Physical, chart, labs and discussed the procedure including the risks, benefits and alternatives for the proposed anesthesia with the patient or authorized representative who has indicated his/her understanding and acceptance.   Dental Advisory  Given  Plan Discussed with: Anesthesiologist, CRNA and Surgeon  Anesthesia Plan Comments:         Anesthesia Quick Evaluation

## 2017-05-26 NOTE — Op Note (Signed)
PREOPERATIVE DIAGNOSIS:  Nuclear sclerotic cataract of the right eye.   POSTOPERATIVE DIAGNOSIS:  nuclear sclerotic cataract right eye   OPERATIVE PROCEDURE: Procedure(s): CATARACT EXTRACTION PHACO AND INTRAOCULAR LENS PLACEMENT (IOC)   SURGEON:  Birder Robson, MD.   ANESTHESIA:  Anesthesiologist: Martha Clan, MD CRNA: Courtney Paris, CRNA  1.      Managed anesthesia care. 2.      0.33ml of Shugarcaine was instilled in the eye following the paracentesis.   COMPLICATIONS:  None.   TECHNIQUE:   Stop and chop   DESCRIPTION OF PROCEDURE:  The patient was examined and consented in the preoperative holding area where the aforementioned topical anesthesia was applied to the right eye and then brought back to the Operating Room where the right eye was prepped and draped in the usual sterile ophthalmic fashion and a lid speculum was placed. A paracentesis was created with the side port blade and the anterior chamber was filled with viscoelastic. A near clear corneal incision was performed with the steel keratome. A continuous curvilinear capsulorrhexis was performed with a cystotome followed by the capsulorrhexis forceps. Hydrodissection and hydrodelineation were carried out with BSS on a blunt cannula. The lens was removed in a stop and chop  technique and the remaining cortical material was removed with the irrigation-aspiration handpiece. The capsular bag was inflated with viscoelastic and the Technis [id number]  lens was placed in the capsular bag without complication. The remaining viscoelastic was removed from the eye with the irrigation-aspiration handpiece. The wounds were hydrated. The anterior chamber was flushed with Miostat and the eye was inflated to physiologic pressure. 0.10ml of Vigamox was placed in the anterior chamber. The wounds were found to be water tight. The eye was dressed with Vigamox. The patient was given protective glasses to wear throughout the day and a shield with which to  sleep tonight. The patient was also given drops with which to begin a drop regimen today and will follow-up with me in one day.  Implant Name Type Inv. Item Serial No. Manufacturer Lot No. LRB No. Used  LENS IOL DIOP 21.5 - [id number] Intraocular Lens LENS IOL DIOP 21.5 [id number]   Right 1   Procedure(s) with comments: CATARACT EXTRACTION PHACO AND INTRAOCULAR LENS PLACEMENT (IOC) (Right) - [place] 00:32 AP% 14.3 CDE 4.64 Fluid pack lot # [id number]  Electronically signed: Ozell Juhasz LOUIS 05/26/2017 8:10 AM

## 2017-05-26 NOTE — H&P (Signed)
All labs reviewed. Abnormal studies sent to patients PCP when indicated.  Previous H&P reviewed, patient examined, there are NO CHANGES.  Carmen Buckley LOUIS7/17/20187:47 AM

## 2017-05-26 NOTE — Transfer of Care (Signed)
Immediate Anesthesia Transfer of Care Note  Patient: Carmen Buckley  Procedure(s) Performed: Procedure(s) with comments: CATARACT EXTRACTION PHACO AND INTRAOCULAR LENS PLACEMENT (IOC) (Right) - [place] 00:32 AP% 14.3 CDE 4.64 Fluid pack lot # [id number]  Patient Location: PACU and Short Stay  Anesthesia Type:MAC  Level of Consciousness: awake and patient cooperative  Airway & Oxygen Therapy: Patient Spontanous Breathing  Post-op Assessment: Report given to RN and Post -op Vital signs reviewed and stable  Post vital signs: Reviewed and stable  Last Vitals:  Vitals:   05/26/17 0652  BP: 120/61  Pulse: 77  Resp: 16  Temp: 36.7 C    Last Pain:  Vitals:   05/26/17 0652  TempSrc: Tympanic         Complications: No apparent anesthesia complications

## 2017-05-26 NOTE — Anesthesia Postprocedure Evaluation (Signed)
Anesthesia Post Note  Patient: Carmen Buckley  Procedure(s) Performed: Procedure(s) (LRB): CATARACT EXTRACTION PHACO AND INTRAOCULAR LENS PLACEMENT (IOC) (Right)  Patient location during evaluation: PACU Anesthesia Type: MAC Level of consciousness: awake and alert Pain management: pain level controlled Vital Signs Assessment: post-procedure vital signs reviewed and stable Respiratory status: spontaneous breathing, nonlabored ventilation, respiratory function stable and patient connected to nasal cannula oxygen Cardiovascular status: stable and blood pressure returned to baseline Anesthetic complications: no     Last Vitals:  Vitals:   05/26/17 0652 05/26/17 0813  BP: 120/61   Pulse: 77 70  Resp: 16 18  Temp: 36.7 C 37.3 C    Last Pain:  Vitals:   05/26/17 0652  TempSrc: Tympanic                 Martha Clan

## 2017-05-26 NOTE — Anesthesia Post-op Follow-up Note (Cosign Needed)
Anesthesia QCDR form completed.        

## 2017-05-26 NOTE — Discharge Instructions (Signed)
Eye Surgery Discharge Instructions  Expect mild scratchy sensation or mild soreness. DO NOT RUB YOUR EYE!  The day of surgery:  Minimal physical activity, but bed rest is not required  No reading, computer work, or close hand work  No bending, lifting, or straining.  May watch TV  For 24 hours:  No driving, legal decisions, or alcoholic beverages  Safety precautions  Eat anything you prefer: It is better to start with liquids, then soup then solid foods.  _____ Eye patch should be worn until postoperative exam tomorrow.  ____ Solar shield eyeglasses should be worn for comfort in the sunlight/patch while sleeping  Resume all regular medications including aspirin or Coumadin if these were discontinued prior to surgery. You may shower, bathe, shave, or wash your hair. Tylenol may be taken for mild discomfort.  Call your doctor if you experience significant pain, nausea, or vomiting, fever > 101 or other signs of infection. [phone] or [phone] Specific instructions:  Follow-up Information    Birder Robson, MD Follow up.   Specialty:  Ophthalmology Why:  2:20 Contact information: [address] [place] [address] [phone]

## 2017-05-27 ENCOUNTER — Encounter: Payer: Self-pay | Admitting: Ophthalmology

## 2017-06-22 DIAGNOSIS — Z961 Presence of intraocular lens: Secondary | ICD-10-CM | POA: Diagnosis not present

## 2017-06-27 ENCOUNTER — Emergency Department
Admission: EM | Admit: 2017-06-27 | Discharge: 2017-06-27 | Disposition: A | Discharge status: Home / Self Care | Payer: Medicare Other | Attending: Emergency Medicine | Admitting: Emergency Medicine

## 2017-06-27 ENCOUNTER — Encounter: Payer: Self-pay | Admitting: Emergency Medicine

## 2017-06-27 DIAGNOSIS — F17210 Nicotine dependence, cigarettes, uncomplicated: Secondary | ICD-10-CM | POA: Insufficient documentation

## 2017-06-27 DIAGNOSIS — S80861A Insect bite (nonvenomous), right lower leg, initial encounter: Secondary | ICD-10-CM | POA: Insufficient documentation

## 2017-06-27 DIAGNOSIS — L089 Local infection of the skin and subcutaneous tissue, unspecified: Secondary | ICD-10-CM | POA: Diagnosis not present

## 2017-06-27 DIAGNOSIS — W57XXXA Bitten or stung by nonvenomous insect and other nonvenomous arthropods, initial encounter: Secondary | ICD-10-CM | POA: Diagnosis not present

## 2017-06-27 DIAGNOSIS — I10 Essential (primary) hypertension: Secondary | ICD-10-CM | POA: Diagnosis not present

## 2017-06-27 DIAGNOSIS — Y998 Other external cause status: Secondary | ICD-10-CM | POA: Diagnosis not present

## 2017-06-27 DIAGNOSIS — Z79899 Other long term (current) drug therapy: Secondary | ICD-10-CM | POA: Insufficient documentation

## 2017-06-27 DIAGNOSIS — Y939 Activity, unspecified: Secondary | ICD-10-CM | POA: Insufficient documentation

## 2017-06-27 DIAGNOSIS — Y929 Unspecified place or not applicable: Secondary | ICD-10-CM | POA: Diagnosis not present

## 2017-06-27 MED ORDER — IBUPROFEN 600 MG PO TABS
600.0000 mg | ORAL_TABLET | Freq: Once | ORAL | Status: AC
  Administered 2017-06-27: 600 mg via ORAL
  Filled 2017-06-27: qty 1

## 2017-06-27 MED ORDER — TRAMADOL HCL 50 MG PO TABS
50.0000 mg | ORAL_TABLET | Freq: Once | ORAL | Status: AC
  Administered 2017-06-27: 50 mg via ORAL
  Filled 2017-06-27: qty 1

## 2017-06-27 MED ORDER — TRAMADOL HCL 50 MG PO TABS: 50.0000 mg | ORAL_TABLET | Freq: Four times a day (QID) | ORAL | 0 refills | Status: DC | PRN

## 2017-06-27 MED ORDER — IBUPROFEN 600 MG PO TABS: 600.0000 mg | ORAL_TABLET | Freq: Three times a day (TID) | ORAL | 0 refills | Status: DC | PRN

## 2017-06-27 MED ORDER — SULFAMETHOXAZOLE-TRIMETHOPRIM 800-160 MG PO TABS: 1.0000 | ORAL_TABLET | Freq: Two times a day (BID) | ORAL | 0 refills | Status: DC

## 2017-06-27 MED ORDER — SULFAMETHOXAZOLE-TRIMETHOPRIM 800-160 MG PO TABS
1.0000 | ORAL_TABLET | Freq: Once | ORAL | Status: AC
  Administered 2017-06-27: 1 via ORAL
  Filled 2017-06-27: qty 1

## 2017-06-27 NOTE — ED Triage Notes (Signed)
Presents with possible insect bite to right lower leg  Area red and swollen

## 2017-06-27 NOTE — ED Provider Notes (Signed)
[facility] Emergency Department Provider Note   ____________________________________________   First MD Initiated Contact with Patient 06/27/17 1451     (approximate)  I have reviewed the triage vital signs and the nursing notes.   HISTORY  Chief Complaint Insect Bite    HPI Carmen Buckley is a 66 y.o. female patient complaining of pain and swelling to the right calf for 2 days. Patient states she felt the insect bite when it occurred only felt mild itching. Patient stated the last 2 days there has been increased redness, redness, and pain.Patient denies fever or chills associated this complaint. Patient denies nausea, vomiting or diarrhea. Patient states she applied alcohol to the area twice yesterday. Patient rates pain as 8/10. Patient described a pain as "achy".   Past Medical History:  Diagnosis Date  . Anxiety   . Depression   . GERD (gastroesophageal reflux disease)   . Hypertension     There are no active problems to display for this patient.   Past Surgical History:  Procedure Laterality Date  . ABDOMINAL HYSTERECTOMY    . CATARACT EXTRACTION W/PHACO Right 05/26/2017   Procedure: CATARACT EXTRACTION PHACO AND INTRAOCULAR LENS PLACEMENT (IOC);  Surgeon: Birder Robson, MD;  Location: [facility] ORS;  Service: Ophthalmology;  Laterality: Right;  [place] 00:32 AP% 14.3 CDE 4.64 Fluid pack lot # [id number]  . TENDON REPAIR Left    ARM    Prior to Admission medications   Medication Sig Start Date End Date Taking? Authorizing Provider  atorvastatin (LIPITOR) 10 MG tablet Take 10 mg by mouth daily.    [provider]  ibuprofen (ADVIL,MOTRIN) 600 MG tablet Take 1 tablet (600 mg total) by mouth every 8 (eight) hours as needed. 06/27/17   Sable Feil, PA-C  LISINOPRIL-HYDROCHLOROTHIAZIDE PO Take by mouth. 40/25 MG HS    [provider]  metoprolol (LOPRESSOR) 50 MG tablet Take 50 mg by mouth daily.    [provider]    omeprazole (PRILOSEC) 20 MG capsule Take 20 mg by mouth daily.    [provider]  sulfamethoxazole-trimethoprim (BACTRIM DS,SEPTRA DS) 800-160 MG tablet Take 1 tablet by mouth 2 (two) times daily. 06/27/17   Sable Feil, PA-C  traMADol (ULTRAM) 50 MG tablet Take 1 tablet (50 mg total) by mouth every 6 (six) hours as needed for moderate pain. 06/27/17   Sable Feil, PA-C    Allergies Patient has no known allergies.  No family history on file.  Social History Social History  Substance Use Topics  . Smoking status: Current Some Day Smoker    Packs/day: 0.50    Types: Cigarettes  . Smokeless tobacco: Never Used  . Alcohol use No    Review of Systems  Constitutional: No fever/chills Eyes: No visual changes. ENT: No sore throat. Cardiovascular: Denies chest pain. Respiratory: Denies shortness of breath. Gastrointestinal: No abdominal pain.  No nausea, no vomiting.  No diarrhea.  No constipation. Genitourinary: Negative for dysuria. Musculoskeletal: Negative for back pain. Skin: Negative for rash. Neurological: Negative for headaches, focal weakness or numbness. Psychiatric:Anxiety and depression Endocrine:Hypertension  ____________________________________________   PHYSICAL EXAM:  VITAL SIGNS: ED Triage Vitals  Enc Vitals Group     BP 06/27/17 1406 107/78     Pulse Rate 06/27/17 1406 66     Resp 06/27/17 1406 20     Temp 06/27/17 1406 98.2 F (36.8 C)     Temp Source 06/27/17 1406 Oral     SpO2 06/27/17  1406 97 %     Weight 06/27/17 1403 160 lb (72.6 kg)     Height 06/27/17 1403 5' (1.524 m)     Head Circumference --      Peak Flow --      Pain Score 06/27/17 1403 8     Pain Loc --      Pain Edu? --      Excl. in [place]? --     Constitutional: Alert and oriented. Well appearing and in no acute distress. Eyes: Conjunctivae are normal. PERRL. EOMI. Head: Atraumatic. Nose: No congestion/rhinnorhea. Mouth/Throat: Mucous membranes are moist.   Oropharynx non-erythematous. Neck: No stridor.  No cervical spine tenderness to palpation. Hematological/Lymphatic/Immunilogical: No cervical lymphadenopathy. Cardiovascular: Normal rate, regular rhythm. Grossly normal heart sounds.  Good peripheral circulation. Respiratory: Normal respiratory effort.  No retractions. Lungs CTAB. Gastrointestinal: Soft and nontender. No distention. No abdominal bruits. No CVA tenderness. Musculoskeletal: No lower extremity tenderness nor edema.  No joint effusions. Neurologic:  Normal speech and language. No gross focal neurologic deficits are appreciated. No gait instability. Skin:  Skin is warm, dry and intact. No rash noted. Psychiatric: Mood and affect are normal. Speech and behavior are normal.  ____________________________________________   LABS (all labs ordered are listed, but only abnormal results are displayed)  Labs Reviewed - No data to display ____________________________________________  EKG   ____________________________________________  RADIOLOGY  No results found.  ____________________________________________   PROCEDURES  Procedure(s) performed: None  Procedures  Critical Care performed: No  ____________________________________________   INITIAL IMPRESSION / ASSESSMENT AND PLAN / ED COURSE  Pertinent labs & imaging results that were available during my care of the patient were reviewed by me and considered in my medical decision making (see chart for details).  Infected insect bite right lower leg. Patient given discharge care instructions. Patient given a dosage of Bactrim, ibuprofen and tramadol prior to departure. Patient advised follow-up family doctor in 2-3 days for reevaluation.      ____________________________________________   FINAL CLINICAL IMPRESSION(S) / ED DIAGNOSES  Final diagnoses:  Bug bite with infection, initial encounter      NEW MEDICATIONS STARTED DURING THIS VISIT:  New  Prescriptions   IBUPROFEN (ADVIL,MOTRIN) 600 MG TABLET    Take 1 tablet (600 mg total) by mouth every 8 (eight) hours as needed.   SULFAMETHOXAZOLE-TRIMETHOPRIM (BACTRIM DS,SEPTRA DS) 800-160 MG TABLET    Take 1 tablet by mouth 2 (two) times daily.   TRAMADOL (ULTRAM) 50 MG TABLET    Take 1 tablet (50 mg total) by mouth every 6 (six) hours as needed for moderate pain.     Note:  This document was prepared using Dragon voice recognition software and may include unintentional dictation errors.    Sable Feil, PA-C 06/27/17 1501    Schuyler Amor, MD 06/27/17 [phone]

## 2017-06-29 ENCOUNTER — Other Ambulatory Visit: Payer: Self-pay

## 2017-07-03 ENCOUNTER — Encounter: Payer: Self-pay | Admitting: Emergency Medicine

## 2017-07-03 ENCOUNTER — Emergency Department
Admission: EM | Admit: 2017-07-03 | Discharge: 2017-07-03 | Disposition: A | Discharge status: Home / Self Care | Payer: Medicare Other | Attending: Emergency Medicine | Admitting: Emergency Medicine

## 2017-07-03 DIAGNOSIS — L03115 Cellulitis of right lower limb: Secondary | ICD-10-CM

## 2017-07-03 DIAGNOSIS — I10 Essential (primary) hypertension: Secondary | ICD-10-CM | POA: Insufficient documentation

## 2017-07-03 DIAGNOSIS — L02415 Cutaneous abscess of right lower limb: Secondary | ICD-10-CM | POA: Insufficient documentation

## 2017-07-03 DIAGNOSIS — L0291 Cutaneous abscess, unspecified: Secondary | ICD-10-CM

## 2017-07-03 DIAGNOSIS — Z23 Encounter for immunization: Secondary | ICD-10-CM | POA: Insufficient documentation

## 2017-07-03 DIAGNOSIS — F17210 Nicotine dependence, cigarettes, uncomplicated: Secondary | ICD-10-CM | POA: Diagnosis not present

## 2017-07-03 DIAGNOSIS — Z79899 Other long term (current) drug therapy: Secondary | ICD-10-CM | POA: Diagnosis not present

## 2017-07-03 MED ORDER — CLINDAMYCIN PHOSPHATE 900 MG/6ML IV SOLN
INTRAVENOUS | Status: AC
  Filled 2017-07-03: qty 6

## 2017-07-03 MED ORDER — TETANUS-DIPHTH-ACELL PERTUSSIS 5-2.5-18.5 LF-MCG/0.5 IM SUSP
0.5000 mL | Freq: Once | INTRAMUSCULAR | Status: AC
  Administered 2017-07-03: 0.5 mL via INTRAMUSCULAR
  Filled 2017-07-03: qty 0.5

## 2017-07-03 MED ORDER — CLINDAMYCIN PHOSPHATE 600 MG/4ML IJ SOLN
600.0000 mg | Freq: Once | INTRAMUSCULAR | Status: AC
  Administered 2017-07-03: 600 mg via INTRAMUSCULAR
  Filled 2017-07-03: qty 4

## 2017-07-03 MED ORDER — CLINDAMYCIN HCL 300 MG PO CAPS: 300.0000 mg | ORAL_CAPSULE | Freq: Three times a day (TID) | ORAL | 0 refills | Status: AC

## 2017-07-03 NOTE — ED Triage Notes (Signed)
Spider bite right leg, seen Friday.  Patient wishes to have a tetanus shot.  Taking ibuprofen, tramadol, and bactrim.

## 2017-07-03 NOTE — ED Provider Notes (Signed)
[facility] Emergency Department Provider Note  ____________________________________________  Time seen: Approximately 7:49 PM  I have reviewed the triage vital signs and the nursing notes.   HISTORY  Chief Complaint Insect Bite    HPI Carmen Buckley is a 66 y.o. female that presents to the emergency department with abscess to right calf that is not improving. Patient was evaluated in the emergency department one week ago for spider bite to and was given Bactrim. Pain and swelling have worsened. She denies any drainage. No fever, shortness breath, chest pain, nausea, vomiting.   Past Medical History:  Diagnosis Date  . Anxiety   . Depression   . GERD (gastroesophageal reflux disease)   . Hypertension     There are no active problems to display for this patient.   Past Surgical History:  Procedure Laterality Date  . ABDOMINAL HYSTERECTOMY    . CATARACT EXTRACTION W/PHACO Right 05/26/2017   Procedure: CATARACT EXTRACTION PHACO AND INTRAOCULAR LENS PLACEMENT (IOC);  Surgeon: Birder Robson, MD;  Location: [facility] ORS;  Service: Ophthalmology;  Laterality: Right;  [place] 00:32 AP% 14.3 CDE 4.64 Fluid pack lot # [id number]  . TENDON REPAIR Left    ARM    Prior to Admission medications   Medication Sig Start Date End Date Taking? Authorizing Provider  atorvastatin (LIPITOR) 10 MG tablet Take 10 mg by mouth daily.    [provider]  clindamycin (CLEOCIN) 300 MG capsule Take 1 capsule (300 mg total) by mouth 3 (three) times daily. 07/03/17 07/13/17  Laban Emperor, PA-C  ibuprofen (ADVIL,MOTRIN) 600 MG tablet Take 1 tablet (600 mg total) by mouth every 8 (eight) hours as needed. 06/27/17   Sable Feil, PA-C  LISINOPRIL-HYDROCHLOROTHIAZIDE PO Take by mouth. 40/25 MG HS    [provider]  metoprolol (LOPRESSOR) 50 MG tablet Take 50 mg by mouth daily.    [provider]  omeprazole (PRILOSEC) 20 MG capsule Take 20 mg by mouth daily.     [provider]  sulfamethoxazole-trimethoprim (BACTRIM DS,SEPTRA DS) 800-160 MG tablet Take 1 tablet by mouth 2 (two) times daily. 06/27/17   Sable Feil, PA-C  traMADol (ULTRAM) 50 MG tablet Take 1 tablet (50 mg total) by mouth every 6 (six) hours as needed for moderate pain. 06/27/17   Sable Feil, PA-C    Allergies Patient has no known allergies.  No family history on file.  Social History Social History  Substance Use Topics  . Smoking status: Current Some Day Smoker    Packs/day: 0.50    Types: Cigarettes  . Smokeless tobacco: Never Used  . Alcohol use No     Review of Systems  Constitutional: No fever/chills Cardiovascular: No chest pain. Respiratory: No SOB. Gastrointestinal: No abdominal pain.  No nausea, no vomiting.  Skin: Negative for abrasions, lacerations, ecchymosis. Neurological: Negative for headaches, numbness or tingling   ____________________________________________   PHYSICAL EXAM:  VITAL SIGNS: ED Triage Vitals  Enc Vitals Group     BP 07/03/17 1816 (!) 153/99     Pulse Rate 07/03/17 1816 96     Resp 07/03/17 1816 16     Temp 07/03/17 1816 98.2 F (36.8 C)     Temp Source 07/03/17 1816 Oral     SpO2 07/03/17 1816 96 %     Weight 07/03/17 1817 160 lb (72.6 kg)     Height 07/03/17 1817 5' (1.524 m)     Head Circumference --      Peak  Flow --      Pain Score 07/03/17 1817 8     Pain Loc --      Pain Edu? --      Excl. in [place]? --      Constitutional: Alert and oriented. Well appearing and in no acute distress. Eyes: Conjunctivae are normal. PERRL. EOMI. Head: Atraumatic. ENT:      Ears:      Nose: No congestion/rhinnorhea.      Mouth/Throat: Mucous membranes are moist.  Neck: No stridor.  Cardiovascular: Normal rate, regular rhythm.  Good peripheral circulation. Respiratory: Normal respiratory effort without tachypnea or retractions. Lungs CTAB. Good air entry to the bases with no decreased or absent breath  sounds. Musculoskeletal: Full range of motion to all extremities. No gross deformities appreciated. Neurologic:  Normal speech and language. No gross focal neurologic deficits are appreciated.  Skin:  Skin is warm, dry and intact. No rash noted. 1cm x 1 cm area of swelling and induration with 2 inches of surrounding erythema to right calf. Actively draining yellow pus from center.   ____________________________________________   LABS (all labs ordered are listed, but only abnormal results are displayed)  Labs Reviewed - No data to display ____________________________________________  EKG   ____________________________________________  RADIOLOGY  No results found.  ____________________________________________    PROCEDURES  Procedure(s) performed:    Procedures    Medications  clindamycin (CLEOCIN) 900 MG/6ML injection (not administered)  Tdap (BOOSTRIX) injection 0.5 mL (0.5 mLs Intramuscular Given 07/03/17 2023)  clindamycin (CLEOCIN) injection 600 mg (600 mg Intramuscular Given 07/03/17 2041)     ____________________________________________   INITIAL IMPRESSION / ASSESSMENT AND PLAN / ED COURSE  Pertinent labs & imaging results that were available during my care of the patient were reviewed by me and considered in my medical decision making (see chart for details).  Review of the [place] CSRS was performed in accordance of the [place] prior to dispensing any controlled drugs.   Patient's diagnosis is consistent with abscess. Vital signs and exam are reassuring. Abscess started to actively drain while in the emergency department. She was given IM Clindamycin in ED. Patient will be discharged home with prescriptions for clindamycin. She will continue to take Bactrim. Patient is to follow up with PCP as directed. Patient is given ED precautions to return to the ED for any worsening or new symptoms.     ____________________________________________  FINAL CLINICAL  IMPRESSION(S) / ED DIAGNOSES  Final diagnoses:  Abscess  Cellulitis of right lower extremity      NEW MEDICATIONS STARTED DURING THIS VISIT:  Discharge Medication List as of 07/03/2017  8:14 PM    START taking these medications   Details  clindamycin (CLEOCIN) 300 MG capsule Take 1 capsule (300 mg total) by mouth 3 (three) times daily., Starting Fri 07/03/2017, Until Mon 07/13/2017, Print            This chart was dictated using voice recognition software/Dragon. Despite best efforts to proofread, errors can occur which can change the meaning. Any change was purely unintentional.    Laban Emperor, PA-C 07/03/17 2235    Carrie Mew, MD 07/04/17 Dyann Kief

## 2017-07-03 NOTE — ED Notes (Signed)

## 2017-07-03 NOTE — ED Notes (Signed)
See triage note  Was sen this week and placed on antibiotics  States she thinks she needs a tetanus shot

## 2017-07-15 ENCOUNTER — Other Ambulatory Visit: Payer: Self-pay | Admitting: Internal Medicine

## 2017-08-06 ENCOUNTER — Other Ambulatory Visit: Payer: Self-pay | Admitting: Internal Medicine

## 2017-08-13 DIAGNOSIS — H3530 Unspecified macular degeneration: Secondary | ICD-10-CM | POA: Diagnosis not present

## 2017-08-13 DIAGNOSIS — I10 Essential (primary) hypertension: Secondary | ICD-10-CM | POA: Diagnosis not present

## 2017-08-19 ENCOUNTER — Other Ambulatory Visit: Payer: Self-pay | Admitting: Internal Medicine

## 2017-08-19 DIAGNOSIS — Z1231 Encounter for screening mammogram for malignant neoplasm of breast: Secondary | ICD-10-CM

## 2017-09-03 ENCOUNTER — Inpatient Hospital Stay: Admission: RE | Admit: 2017-09-03 | Payer: Self-pay | Source: Ambulatory Visit

## 2018-01-05 DIAGNOSIS — H2000 Unspecified acute and subacute iridocyclitis: Secondary | ICD-10-CM | POA: Diagnosis not present

## 2018-01-19 DIAGNOSIS — H2000 Unspecified acute and subacute iridocyclitis: Secondary | ICD-10-CM | POA: Diagnosis not present

## 2018-08-20 DIAGNOSIS — H353231 Exudative age-related macular degeneration, bilateral, with active choroidal neovascularization: Secondary | ICD-10-CM | POA: Diagnosis not present

## 2018-09-06 DIAGNOSIS — H353231 Exudative age-related macular degeneration, bilateral, with active choroidal neovascularization: Secondary | ICD-10-CM | POA: Diagnosis not present

## 2018-09-13 DIAGNOSIS — H353231 Exudative age-related macular degeneration, bilateral, with active choroidal neovascularization: Secondary | ICD-10-CM | POA: Diagnosis not present

## 2018-09-13 DIAGNOSIS — H353211 Exudative age-related macular degeneration, right eye, with active choroidal neovascularization: Secondary | ICD-10-CM | POA: Diagnosis not present

## 2018-09-13 DIAGNOSIS — H353221 Exudative age-related macular degeneration, left eye, with active choroidal neovascularization: Secondary | ICD-10-CM | POA: Diagnosis not present

## 2018-09-20 ENCOUNTER — Other Ambulatory Visit: Payer: Self-pay

## 2018-09-20 ENCOUNTER — Emergency Department: Payer: Medicare HMO

## 2018-09-20 ENCOUNTER — Emergency Department
Admission: EM | Admit: 2018-09-20 | Discharge: 2018-09-20 | Disposition: A | Discharge status: Home / Self Care | Payer: Medicare HMO | Attending: Emergency Medicine | Admitting: Emergency Medicine

## 2018-09-20 DIAGNOSIS — I10 Essential (primary) hypertension: Secondary | ICD-10-CM | POA: Insufficient documentation

## 2018-09-20 DIAGNOSIS — J40 Bronchitis, not specified as acute or chronic: Secondary | ICD-10-CM | POA: Diagnosis not present

## 2018-09-20 DIAGNOSIS — Z79899 Other long term (current) drug therapy: Secondary | ICD-10-CM | POA: Diagnosis not present

## 2018-09-20 DIAGNOSIS — R05 Cough: Secondary | ICD-10-CM | POA: Diagnosis not present

## 2018-09-20 DIAGNOSIS — F17210 Nicotine dependence, cigarettes, uncomplicated: Secondary | ICD-10-CM | POA: Diagnosis not present

## 2018-09-20 MED ORDER — PREDNISONE 10 MG (21) PO TBPK: ORAL_TABLET | ORAL | 0 refills | Status: DC

## 2018-09-20 MED ORDER — DIPHENHYDRAMINE HCL 25 MG PO CAPS
25.0000 mg | ORAL_CAPSULE | Freq: Once | ORAL | Status: AC
  Administered 2018-09-20: 25 mg via ORAL
  Filled 2018-09-20: qty 1

## 2018-09-20 MED ORDER — AZITHROMYCIN 250 MG PO TABS: ORAL_TABLET | ORAL | 0 refills | Status: DC

## 2018-09-20 MED ORDER — IPRATROPIUM-ALBUTEROL 0.5-2.5 (3) MG/3ML IN SOLN
3.0000 mL | Freq: Once | RESPIRATORY_TRACT | Status: AC
  Administered 2018-09-20: 3 mL via RESPIRATORY_TRACT
  Filled 2018-09-20: qty 3

## 2018-09-20 MED ORDER — DEXAMETHASONE SODIUM PHOSPHATE 10 MG/ML IJ SOLN
10.0000 mg | Freq: Once | INTRAMUSCULAR | Status: AC
  Administered 2018-09-20: 10 mg via INTRAMUSCULAR
  Filled 2018-09-20: qty 1

## 2018-09-20 MED ORDER — BENZONATATE 100 MG PO CAPS: ORAL_CAPSULE | ORAL | 0 refills | Status: DC

## 2018-09-20 MED ORDER — ACETAMINOPHEN 325 MG PO TABS
650.0000 mg | ORAL_TABLET | Freq: Once | ORAL | Status: AC
  Administered 2018-09-20: 650 mg via ORAL
  Filled 2018-09-20: qty 2

## 2018-09-20 NOTE — ED Triage Notes (Signed)
Pt c/o cough with congestion and irritated throat for the past week

## 2018-09-20 NOTE — Discharge Instructions (Addendum)
Your exam and chest x-ray are consistent with a likely viral URI and bronchitis. Take the prescription meds as directed. Drink fluids and rest as needed. Follow-up with Dr. Lavera Guise, or return to the ED as needed.

## 2018-09-20 NOTE — ED Notes (Signed)
See triage note  Presents with nasal congestion and sinus pressure  States these sx's started about 1 week  Then states cough and some chest discomfort with cough over the weekend  No fever  Some exp wheezing noted at present

## 2018-09-20 NOTE — ED Provider Notes (Signed)
[facility] Emergency Department Provider Note ____________________________________________  Time seen: 1838  I have reviewed the triage vital signs and the nursing notes.  HISTORY  Chief Complaint  URI  HPI Carmen Buckley is a 66 y.o. female presents herself to the ED for evaluation and management of persistent nonproductive cough and some mild shortness of breath.  Patient reports she has had symptoms for the last week.  Feeling some throat irritation.  She denies any chest pain, nausea, vomiting, or dizziness.  She has been taking Theraflu, honey & lemon, Bayer aspirin, and Alka-Seltzer effervescent tabs, without significant benefit. She denies any frank fevers or purulent sputum or nasal drainage.  She has a history of hypertension and reflux.  Past Medical History:  Diagnosis Date  . Anxiety   . Depression   . GERD (gastroesophageal reflux disease)   . Hypertension     There are no active problems to display for this patient.   Past Surgical History:  Procedure Laterality Date  . ABDOMINAL HYSTERECTOMY    . CATARACT EXTRACTION W/PHACO Right 05/26/2017   Procedure: CATARACT EXTRACTION PHACO AND INTRAOCULAR LENS PLACEMENT (IOC);  Surgeon: Birder Robson, MD;  Location: [facility] ORS;  Service: Ophthalmology;  Laterality: Right;  [place] 00:32 AP% 14.3 CDE 4.64 Fluid pack lot # [id number]  . TENDON REPAIR Left    ARM    Prior to Admission medications   Medication Sig Start Date End Date Taking? Authorizing Provider  atorvastatin (LIPITOR) 10 MG tablet Take 10 mg by mouth daily.    [provider]  azithromycin (ZITHROMAX Z-PAK) 250 MG tablet Take 2 tablets (500 mg) on  Day 1,  followed by 1 tablet (250 mg) once daily on Days 2 through 5. 09/20/18   Vitor Overbaugh, Dannielle Karvonen, PA-C  benzonatate (TESSALON PERLES) 100 MG capsule Take 1-2 tabs TID prn cough 09/20/18   Darbi Chandran, Dannielle Karvonen, PA-C  LISINOPRIL-HYDROCHLOROTHIAZIDE PO Take by mouth. 40/25 MG  HS    [provider]  metoprolol (LOPRESSOR) 50 MG tablet Take 50 mg by mouth daily.    [provider]  omeprazole (PRILOSEC) 20 MG capsule Take 20 mg by mouth daily.    [provider]  predniSONE (STERAPRED UNI-PAK 21 TAB) 10 MG (21) TBPK tablet 6-day taper as directed. 09/20/18   Daire Okimoto, Dannielle Karvonen, PA-C    Allergies Patient has no known allergies.  No family history on file.  Social History Social History   Tobacco Use  . Smoking status: Current Some Day Smoker    Packs/day: 0.50    Types: Cigarettes  . Smokeless tobacco: Never Used  Substance Use Topics  . Alcohol use: No  . Drug use: No    Review of Systems  Constitutional: Negative for fever. Eyes: Negative for visual changes. ENT: Positive for sore throat runny and stuffy nose Cardiovascular: Negative for chest pain. Respiratory: Positive for cough and shortness of breath. Gastrointestinal: Negative for abdominal pain, vomiting and diarrhea. Genitourinary: Negative for dysuria. Musculoskeletal: Negative for back pain. Skin: Negative for rash. Neurological: Negative for headaches, focal weakness or numbness. ____________________________________________  PHYSICAL EXAM:  VITAL SIGNS: ED Triage Vitals  Enc Vitals Group     BP 09/20/18 1748 (!) 159/109     Pulse Rate 09/20/18 1748 78     Resp 09/20/18 1748 18     Temp 09/20/18 1748 98.8 F (37.1 C)     Temp Source 09/20/18 1748 Oral     SpO2 09/20/18 1748 97 %  Weight 09/20/18 1749 157 lb (71.2 kg)     Height 09/20/18 1749 5' (1.524 m)     Head Circumference --      Peak Flow --      Pain Score 09/20/18 1748 8     Pain Loc --      Pain Edu? --      Excl. in [place]? --     Constitutional: Alert and oriented. Well appearing and in no distress. Head: Normocephalic and atraumatic. Eyes: Conjunctivae are normal. Normal extraocular movements Ears: Canals clear. TMs intact bilaterally. Nose: No  congestion/rhinorrhea/epistaxis. Mouth/Throat: Mucous membranes are moist. No oral lesions noted. Cardiovascular: Normal rate, regular rhythm. Normal distal pulses. Respiratory: Normal respiratory effort. No wheezes/rales/rhonchi. Musculoskeletal: Nontender with normal range of motion in all extremities.  Neurologic:  Normal gait without ataxia. Normal speech and language. No gross focal neurologic deficits are appreciated. Skin:  Skin is warm, dry and intact. No rash noted. ____________________________________________   RADIOLOGY  CXR  IMPRESSION: 1. No acute cardiopulmonary abnormality. 2. Possible pulmonary hyperinflation. Increased interstitial markings probably related to smoking. ____________________________________________  PROCEDURES  Procedures Duoneb x 1 Decadron 10 mg IM Benadryl 25 mg PO Tylenol 650 mg PO ____________________________________________  INITIAL IMPRESSION / ASSESSMENT AND PLAN / ED COURSE  Patient with ED evaluation of a one-week complaint of cough and congestion as well as some mild shortness of breath.  Patient's x-ray is negative for any acute cardiopulmonary process.  Symptoms likely represent a viral etiology of bronchitis.  We will treat the patient empirically for any potential CAP with a azithromycin.  She is otherwise discharged with a prescription for prednisone taper, and Tessalon Perles.  She is encouraged to follow-up with her primary provider for ongoing symptom management.  Return precautions have been reviewed. ____________________________________________  FINAL CLINICAL IMPRESSION(S) / ED DIAGNOSES  Final diagnoses:  Bronchitis     Juwon Scripter, Dannielle Karvonen, PA-C 09/20/18 2210    Harvest Dark, MD 09/20/18 [phone]

## 2018-10-13 DIAGNOSIS — H353231 Exudative age-related macular degeneration, bilateral, with active choroidal neovascularization: Secondary | ICD-10-CM | POA: Diagnosis not present

## 2018-11-15 DIAGNOSIS — H353231 Exudative age-related macular degeneration, bilateral, with active choroidal neovascularization: Secondary | ICD-10-CM | POA: Diagnosis not present

## 2019-02-01 DIAGNOSIS — I10 Essential (primary) hypertension: Secondary | ICD-10-CM | POA: Diagnosis not present

## 2019-02-01 DIAGNOSIS — K210 Gastro-esophageal reflux disease with esophagitis: Secondary | ICD-10-CM | POA: Diagnosis not present

## 2019-02-01 DIAGNOSIS — G4700 Insomnia, unspecified: Secondary | ICD-10-CM | POA: Diagnosis not present

## 2019-03-08 ENCOUNTER — Other Ambulatory Visit: Payer: Self-pay

## 2019-03-08 NOTE — Patient Outreach (Signed)
[place] [facility]) Care Management  03/08/2019  Carmen Buckley 19-May-1951 [id number]   Medication Adherence call to [name] Compliant Voice message left with a call back number. [name] is showing past due under [place].   [place] Management Direct Dial [phone]  Fax [phone] [name]

## 2019-03-25 DIAGNOSIS — H353231 Exudative age-related macular degeneration, bilateral, with active choroidal neovascularization: Secondary | ICD-10-CM | POA: Diagnosis not present

## 2019-04-28 DIAGNOSIS — H353212 Exudative age-related macular degeneration, right eye, with inactive choroidal neovascularization: Secondary | ICD-10-CM | POA: Diagnosis not present

## 2019-07-01 ENCOUNTER — Other Ambulatory Visit: Payer: Self-pay

## 2019-07-01 NOTE — Patient Outreach (Signed)
[place] [facility]) Care Management  07/01/2019  Carmen Buckley [birth year] [id number]   Medication Adherence call to Carmen Buckley Compliant Voice message left with a call back number. Carmen Buckley is showing past due on Atorvastatin 10 mg under [place].   [place] Management Direct Dial [phone]  Fax [phone] [email or web address]

## 2019-07-20 ENCOUNTER — Other Ambulatory Visit: Payer: Self-pay

## 2019-07-20 NOTE — Patient Outreach (Signed)
[place] [facility]) Care Management  07/20/2019  Carmen Buckley 06/08/1951 [id number]   Medication Adherence call to [name]  Patient did not answer and voice mail is not set up .[name] is showing past due on Lisinopril/Hctz 20/12.5 mg under [place].   [place] Management Direct Dial [phone]  Fax [phone] [email or web address]

## 2019-08-29 DIAGNOSIS — H353212 Exudative age-related macular degeneration, right eye, with inactive choroidal neovascularization: Secondary | ICD-10-CM | POA: Diagnosis not present

## 2019-09-02 DIAGNOSIS — H353231 Exudative age-related macular degeneration, bilateral, with active choroidal neovascularization: Secondary | ICD-10-CM | POA: Diagnosis not present

## 2020-04-01 IMAGING — CR DG CHEST 2V
1 series · 2 of 2 positions shown · non-contrast
Comparison: Chest radiograph 05/05/2012 and 12/31/2008.

CLINICAL DATA: 67-year-old female with cough congestion and fever
for 1 week. Chest tightness. Smoker.

EXAM:
CHEST - 2 VIEW

[Series 1: dg chest 2 view · 0.14mm/px · 2 of 2 slices shown]
[im 1/2]
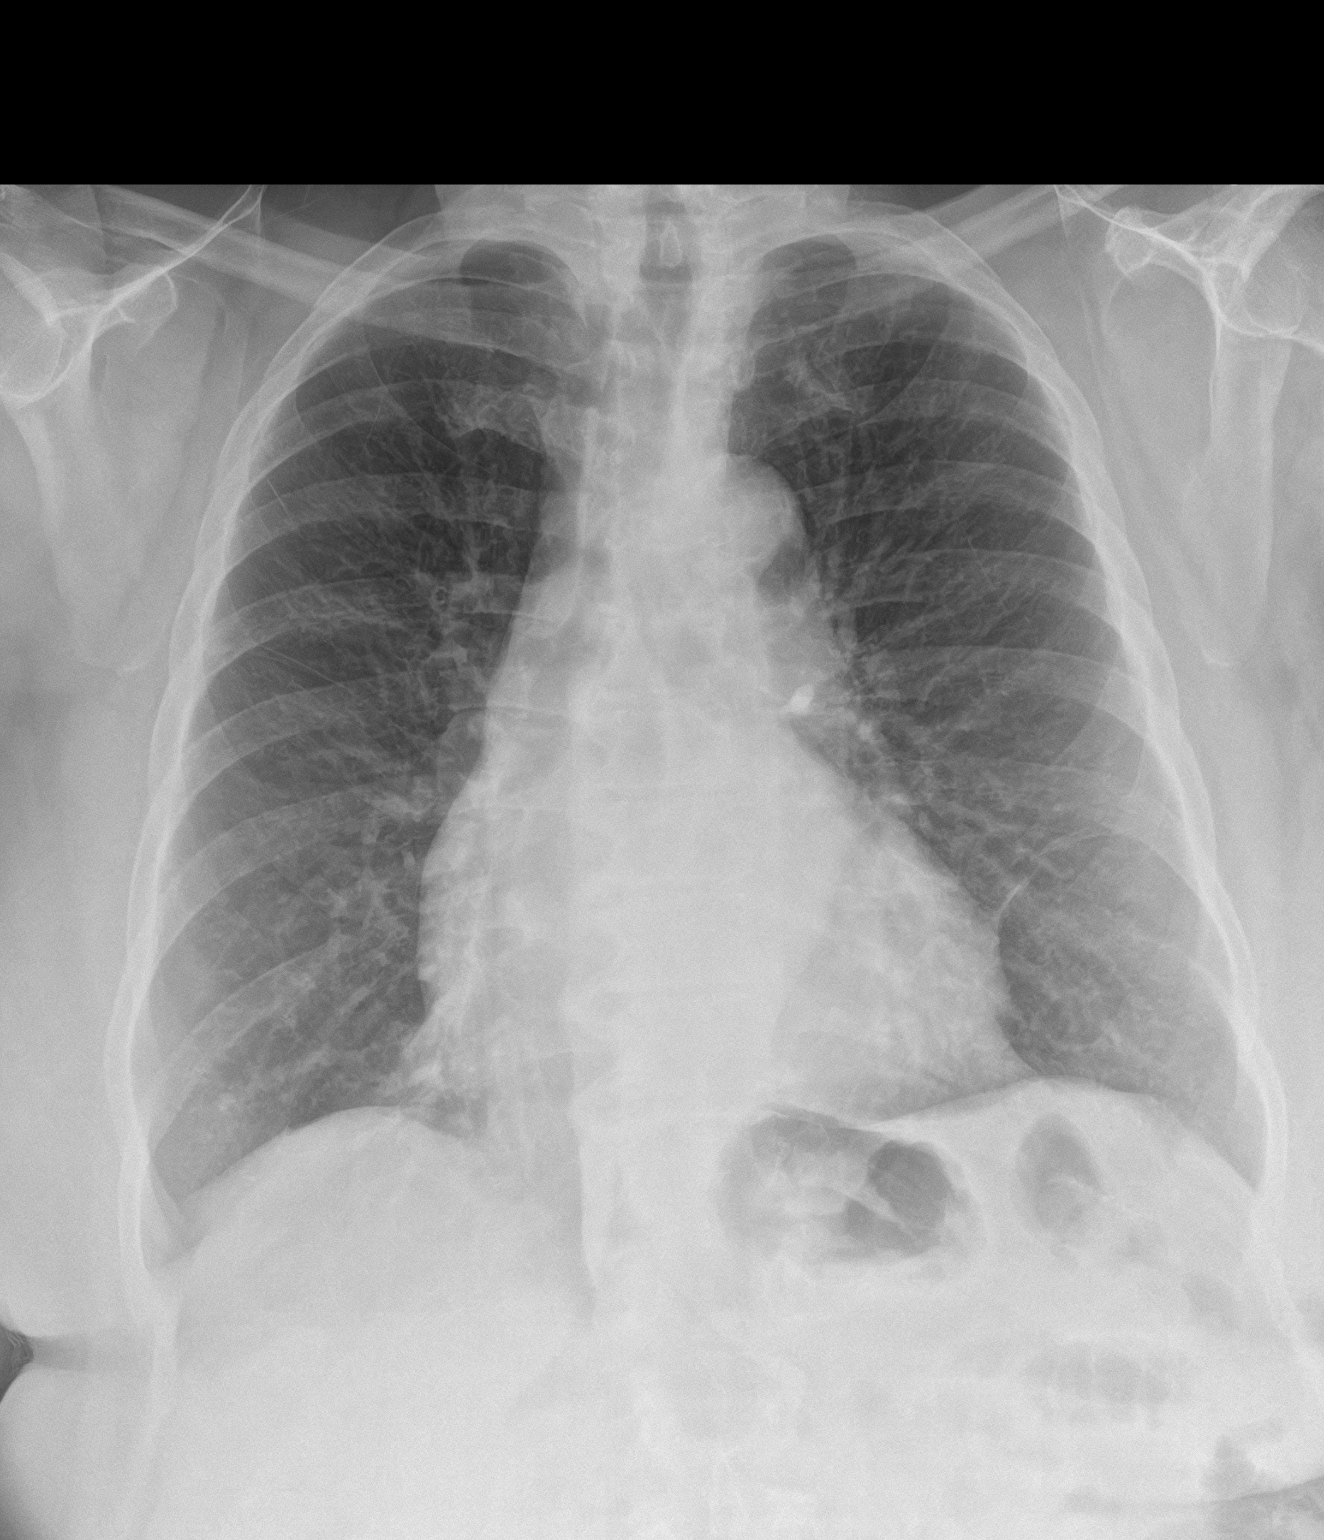
[im 2/2]
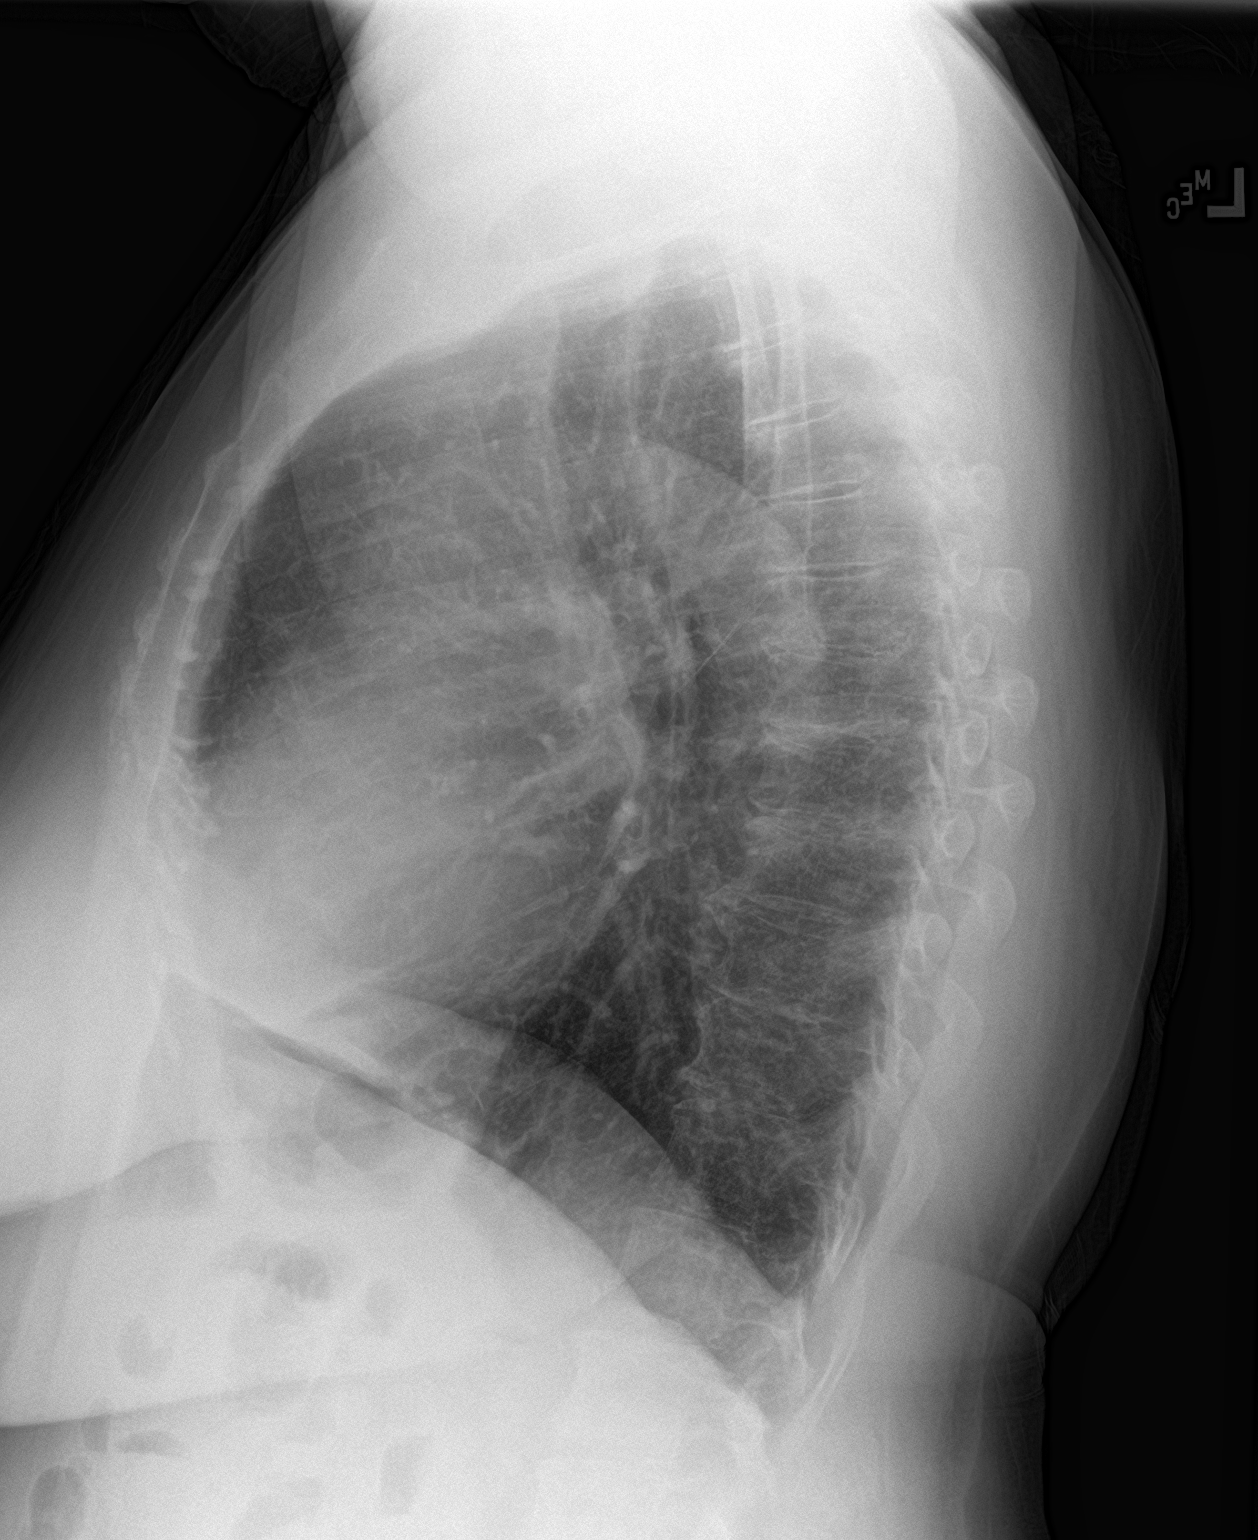

[2 of 2 positions shown; findings below may reference images not displayed]

FINDINGS: Lung volumes are at the upper limits of normal to mildly
hyperinflated. Stable mild cardiomegaly. Other mediastinal contours
are within normal limits. Visualized tracheal air column is within
normal limits. No pneumothorax, pulmonary edema, pleural effusion or
confluent pulmonary opacity. Mild increased interstitial markings
appear stable.

Osteopenia. No acute osseous abnormality identified. Negative
visible bowel gas pattern.
IMPRESSION: 1. No acute cardiopulmonary abnormality.
2. Possible pulmonary hyperinflation. Increased interstitial
markings probably related to smoking.

## 2020-04-16 ENCOUNTER — Ambulatory Visit: Payer: Medicare HMO | Admitting: Internal Medicine

## 2020-06-11 DIAGNOSIS — H353231 Exudative age-related macular degeneration, bilateral, with active choroidal neovascularization: Secondary | ICD-10-CM | POA: Diagnosis not present

## 2020-06-11 DIAGNOSIS — H353212 Exudative age-related macular degeneration, right eye, with inactive choroidal neovascularization: Secondary | ICD-10-CM | POA: Diagnosis not present

## 2020-07-02 ENCOUNTER — Ambulatory Visit (INDEPENDENT_AMBULATORY_CARE_PROVIDER_SITE_OTHER): Payer: Medicare Other | Admitting: Internal Medicine

## 2020-07-02 ENCOUNTER — Encounter: Payer: Self-pay | Admitting: Internal Medicine

## 2020-07-02 ENCOUNTER — Other Ambulatory Visit: Payer: Self-pay

## 2020-07-02 ENCOUNTER — Other Ambulatory Visit: Payer: Self-pay | Admitting: Internal Medicine

## 2020-07-02 VITALS — BP 160/94 | HR 97 | Ht 64.0 in | Wt 179.1 lb

## 2020-07-02 DIAGNOSIS — J0100 Acute maxillary sinusitis, unspecified: Secondary | ICD-10-CM | POA: Diagnosis not present

## 2020-07-02 DIAGNOSIS — E669 Obesity, unspecified: Secondary | ICD-10-CM

## 2020-07-02 DIAGNOSIS — J301 Allergic rhinitis due to pollen: Secondary | ICD-10-CM

## 2020-07-02 DIAGNOSIS — Z720 Tobacco use: Secondary | ICD-10-CM | POA: Diagnosis not present

## 2020-07-02 DIAGNOSIS — I10 Essential (primary) hypertension: Secondary | ICD-10-CM | POA: Insufficient documentation

## 2020-07-02 DIAGNOSIS — F17210 Nicotine dependence, cigarettes, uncomplicated: Secondary | ICD-10-CM | POA: Insufficient documentation

## 2020-07-02 MED ORDER — AZITHROMYCIN 250 MG PO TABS: ORAL_TABLET | ORAL | 0 refills | Status: DC

## 2020-07-02 MED ORDER — LORATADINE 10 MG PO TABS: 10.0000 mg | ORAL_TABLET | Freq: Every day | ORAL | 11 refills | Status: DC

## 2020-07-02 NOTE — Assessment & Plan Note (Signed)
Patient was started on Zithromax and Claritin 5 mg p.o. daily.  She was advised to see a ENT specialist if she is not better

## 2020-07-02 NOTE — Assessment & Plan Note (Signed)
-   I instructed the patient to stop smoking and provided them with smoking cessation materials.  - I informed the patient that smoking puts them at increased risk for cancer, COPD, hypertension, and more.  - Informed the patient to seek help if they begin to have trouble breathing, develop chest pain, start to cough up blood, feel faint, or pass out.  

## 2020-07-02 NOTE — Assessment & Plan Note (Signed)
-   I encouraged the patient to lose weight.  - I educated them on making healthy dietary choices including eating more fruits and vegetables and less fried foods. - I encouraged the patient to exercise more, and educated on the benefits of exercise including weight loss, diabetes prevention, and hypertension prevention.   

## 2020-07-02 NOTE — Progress Notes (Signed)
Patient ID: Carmen Buckley, female   DOB: 21-Nov-1950, 69 y.o.   MRN: [id number]    Established Patient Office Visit  Subjective:  Patient ID: Carmen Buckley, female    DOB: 07/04/1951  Age: 69 y.o. MRN: [id number]  CC:  Chief Complaint  Patient presents with  . URI    had cold several weeks ago, but cough is still lingering     HPI  Carmen Buckley presents for environmental allergies. She has a productive cough that is worse in the night and mornings. She has tried OTC medications but they do not help. She also reports sinus pressure, postnasal drip, and congestion. The patient is not planning to receive the COVID-19 vaccine because "it is not in the Bible." The patient smokes cigarettes.  Past Medical History:  Diagnosis Date  . Anxiety   . Depression   . GERD (gastroesophageal reflux disease)   . Hypertension     Past Surgical History:  Procedure Laterality Date  . ABDOMINAL HYSTERECTOMY    . CATARACT EXTRACTION W/PHACO Right 05/26/2017   Procedure: CATARACT EXTRACTION PHACO AND INTRAOCULAR LENS PLACEMENT (IOC);  Surgeon: Birder Robson, MD;  Location: [facility] ORS;  Service: Ophthalmology;  Laterality: Right;  [place] 00:32 AP% 14.3 CDE 4.64 Fluid pack lot # [id number]  . TENDON REPAIR Left    ARM    No family history on file.  Social History   Socioeconomic History  . Marital status: Single    Spouse name: Not on file  . Number of children: Not on file  . Years of education: Not on file  . Highest education level: Not on file  Occupational History  . Not on file  Tobacco Use  . Smoking status: Current Some Day Smoker    Packs/day: 0.50    Types: Cigarettes  . Smokeless tobacco: Never Used  Substance and Sexual Activity  . Alcohol use: No  . Drug use: No  . Sexual activity: Not on file  Other Topics Concern  . Not on file  Social History Narrative  . Not on file   Social Determinants of Health   Financial Resource Strain:   . Difficulty of Paying Living  Expenses: Not on file  Food Insecurity:   . Worried About Charity fundraiser in the Last Year: Not on file  . Ran Out of Food in the Last Year: Not on file  Transportation Needs:   . Lack of Transportation (Medical): Not on file  . Lack of Transportation (Non-Medical): Not on file  Physical Activity:   . Days of Exercise per Week: Not on file  . Minutes of Exercise per Session: Not on file  Stress:   . Feeling of Stress : Not on file  Social Connections:   . Frequency of Communication with Friends and Family: Not on file  . Frequency of Social Gatherings with Friends and Family: Not on file  . Attends Religious Services: Not on file  . Active Member of Clubs or Organizations: Not on file  . Attends Archivist Meetings: Not on file  . Marital Status: Not on file  Intimate Partner Violence:   . Fear of Current or Ex-Partner: Not on file  . Emotionally Abused: Not on file  . Physically Abused: Not on file  . Sexually Abused: Not on file     Current Outpatient Medications:  .  atorvastatin (LIPITOR) 10 MG tablet, Take 10 mg by mouth daily., Disp: , Rfl:  .  benzonatate (TESSALON  PERLES) 100 MG capsule, Take 1-2 tabs TID prn cough, Disp: 30 capsule, Rfl: 0 .  LISINOPRIL-HYDROCHLOROTHIAZIDE PO, Take by mouth. 40/25 MG HS, Disp: , Rfl:  .  loratadine (CLARITIN) 10 MG tablet, Take 10 mg by mouth daily., Disp: , Rfl:  .  metoprolol (LOPRESSOR) 50 MG tablet, Take 50 mg by mouth daily., Disp: , Rfl:  .  omeprazole (PRILOSEC) 20 MG capsule, Take 20 mg by mouth daily., Disp: , Rfl:  .  azithromycin (ZITHROMAX) 250 MG tablet, 2 tab  Po daily  For 3 days, Disp: 6 tablet, Rfl: 0 .  loratadine (CLARITIN) 10 MG tablet, Take 1 tablet (10 mg total) by mouth daily., Disp: 30 tablet, Rfl: 11   No Known Allergies  ROS Review of Systems  HENT: Positive for congestion, postnasal drip and sinus pressure.   Respiratory: Positive for cough.   Allergic/Immunologic: Positive for environmental  allergies.  Neurological: Negative for headaches.  All other systems reviewed and are negative.     Objective:    Physical Exam Vitals reviewed.  Constitutional:      Appearance: Normal appearance.  HENT:     Mouth/Throat:     Mouth: Mucous membranes are moist.     Comments: Congested, post-nasal drip. Eyes:     Pupils: Pupils are equal, round, and reactive to light.  Neck:     Vascular: No carotid bruit.  Cardiovascular:     Rate and Rhythm: Normal rate and regular rhythm.     Pulses: Normal pulses.     Heart sounds: Normal heart sounds.  Pulmonary:     Effort: Pulmonary effort is normal.     Breath sounds: Normal breath sounds.  Abdominal:     General: Bowel sounds are normal.     Palpations: Abdomen is soft. There is no hepatomegaly, splenomegaly or mass.     Tenderness: There is no abdominal tenderness.     Hernia: No hernia is present.  Musculoskeletal:        General: No tenderness.     Cervical back: Neck supple.     Right lower leg: No edema.     Left lower leg: No edema.  Skin:    Findings: No rash.  Neurological:     Mental Status: She is alert and oriented to person, place, and time.     Motor: No weakness.  Psychiatric:        Mood and Affect: Mood and affect normal.        Behavior: Behavior normal.     BP (!) 160/94   Pulse 97   Ht 5\' 4"  (1.626 m)   Wt 179 lb 1.6 oz (81.2 kg)   BMI 30.74 kg/m  Wt Readings from Last 3 Encounters:  07/02/20 179 lb 1.6 oz (81.2 kg)  09/20/18 157 lb (71.2 kg)  07/03/17 160 lb (72.6 kg)     Health Maintenance Due  Topic Date Due  . Hepatitis C Screening  Never done  . COVID-19 Vaccine (1) Never done  . COLONOSCOPY  Never done  . DEXA SCAN  Never done  . PNA vac Low Risk Adult (1 of 2 - PCV13) Never done  . MAMMOGRAM  06/30/2018  . INFLUENZA VACCINE  06/10/2020    There are no preventive care reminders to display for this patient.  No results found for: TSH Lab Results  Component Value Date   WBC  11.3 (H) 03/26/2015   HGB 14.3 03/26/2015   HCT 43.3 03/26/2015   MCV  94.4 03/26/2015   PLT 237 03/26/2015   Lab Results  Component Value Date   NA 140 03/26/2015   K 3.3 (L) 03/26/2015   CO2 25 03/26/2015   GLUCOSE 94 03/26/2015   BUN 12 03/26/2015   CREATININE 0.92 03/26/2015   CALCIUM 9.5 03/26/2015   ANIONGAP 9 03/26/2015   Lab Results  Component Value Date   CHOL 218 (H) 05/06/2012   Lab Results  Component Value Date   HDL 52 05/06/2012   Lab Results  Component Value Date   LDLCALC 151 (H) 05/06/2012   Lab Results  Component Value Date   TRIG 75 05/06/2012   No results found for: CHOLHDL No results found for: HGBA1C    Assessment & Plan:   Problem List Items Addressed This Visit      Cardiovascular and Mediastinum   Essential hypertension    - Today, the patient's blood pressure is well managed on lisinopril . - The patient will continue the current treatment regimen.  - I encouraged the patient to eat a low-sodium diet to help control blood pressure. - I encouraged the patient to live an active lifestyle and complete activities that increases heart rate to 85% target heart rate at least 5 times per week for one hour.            Respiratory   Subacute maxillary sinusitis    Patient was started on Zithromax and Claritin 5 mg p.o. daily.  She was advised to see a ENT specialist if she is not better      Relevant Medications   azithromycin (ZITHROMAX) 250 MG tablet   loratadine (CLARITIN) 10 MG tablet   Seasonal allergic rhinitis due to pollen - Primary     Other   Obesity (BMI 30-39.9)    - I encouraged the patient to lose weight.  - I educated them on making healthy dietary choices including eating more fruits and vegetables and less fried foods. - I encouraged the patient to exercise more, and educated on the benefits of exercise including weight loss, diabetes prevention, and hypertension prevention.        Tobacco abuse    - I instructed  the patient to stop smoking and provided them with smoking cessation materials.  - I informed the patient that smoking puts them at increased risk for cancer, COPD, hypertension, and more.  - Informed the patient to seek help if they begin to have trouble breathing, develop chest pain, start to cough up blood, feel faint, or pass out.          Meds ordered this encounter  Medications  . azithromycin (ZITHROMAX) 250 MG tablet    Sig: 2 tab  Po daily  For 3 days    Dispense:  6 tablet    Refill:  0  . loratadine (CLARITIN) 10 MG tablet    Sig: Take 1 tablet (10 mg total) by mouth daily.    Dispense:  30 tablet    Refill:  11    Follow-up: No follow-ups on file.    By signing my name below, I, De Burrs, attest that this documentation has been prepared under the direction and in the presence of Cletis Athens, MD. Electronically Signed: De Burrs, Medical Scribe. 07/02/20. 12:33 PM.  {I personally performed the services described in this documentation, which was SCRIBED in my presence. The recorded information has been reviewed and considered accurate. It has been edited as necessary during review. Cletis Athens, MD

## 2020-07-02 NOTE — Assessment & Plan Note (Signed)
-   Today, the patient's blood pressure is well managed on lisinopril. - The patient will continue the current treatment regimen.  - I encouraged the patient to eat a low-sodium diet to help control blood pressure. - I encouraged the patient to live an active lifestyle and complete activities that increases heart rate to 85% target heart rate at least 5 times per week for one hour.     

## 2020-07-03 ENCOUNTER — Ambulatory Visit (INDEPENDENT_AMBULATORY_CARE_PROVIDER_SITE_OTHER): Payer: Medicare Other | Admitting: Podiatry

## 2020-07-03 ENCOUNTER — Ambulatory Visit (INDEPENDENT_AMBULATORY_CARE_PROVIDER_SITE_OTHER): Payer: Medicare Other

## 2020-07-03 ENCOUNTER — Encounter: Payer: Self-pay | Admitting: Podiatry

## 2020-07-03 DIAGNOSIS — M722 Plantar fascial fibromatosis: Secondary | ICD-10-CM

## 2020-07-03 NOTE — Progress Notes (Signed)
  Subjective:  Patient ID: Carmen Buckley, female    DOB: September 03, 1951,  MRN: [id number]  No chief complaint on file.   69 y.o. female presents with the above complaint.  Patient presents with right heel pain that has been off for weeks as progressing on worse.  Is worse in the morning while standing.  Patient states it feels like a sore.  Patient states that it also feels like a bruise.  She is experiencing post static dyskinesia type of symptoms.  Patient states that sharp shooting in nature pain scale 7 out of 10.  Patient's tried over-the-counter inserts but nothing else.  She has not seen anyone else prior to see me.   Review of Systems: Negative except as noted in the HPI. Denies N/V/F/Ch.  Past Medical History:  Diagnosis Date  . Anxiety   . Depression   . GERD (gastroesophageal reflux disease)   . Hypertension     Current Outpatient Medications:  .  atorvastatin (LIPITOR) 10 MG tablet, Take 10 mg by mouth daily., Disp: , Rfl:  .  azithromycin (ZITHROMAX) 250 MG tablet, TAKE 2 TABLETS BY MOUTH DAILY FOR 3 DAYS, Disp: 6 tablet, Rfl: 0 .  benzonatate (TESSALON PERLES) 100 MG capsule, Take 1-2 tabs TID prn cough, Disp: 30 capsule, Rfl: 0 .  LISINOPRIL-HYDROCHLOROTHIAZIDE PO, Take by mouth. 40/25 MG HS, Disp: , Rfl:  .  loratadine (CLARITIN) 10 MG tablet, Take 10 mg by mouth daily., Disp: , Rfl:  .  loratadine (CLARITIN) 10 MG tablet, Take 1 tablet (10 mg total) by mouth daily., Disp: 30 tablet, Rfl: 11 .  metoprolol (LOPRESSOR) 50 MG tablet, Take 50 mg by mouth daily., Disp: , Rfl:  .  omeprazole (PRILOSEC) 20 MG capsule, Take 20 mg by mouth daily., Disp: , Rfl:   Social History   Tobacco Use  Smoking Status Current Some Day Smoker  . Packs/day: 0.50  . Types: Cigarettes  Smokeless Tobacco Never Used    No Known Allergies Objective:  There were no vitals filed for this visit. There is no height or weight on file to calculate BMI. Constitutional Well developed. Well  nourished.  Vascular Dorsalis pedis pulses palpable bilaterally. Posterior tibial pulses palpable bilaterally. Capillary refill normal to all digits.  No cyanosis or clubbing noted. Pedal hair growth normal.  Neurologic Normal speech. Oriented to person, place, and time. Epicritic sensation to light touch grossly present bilaterally.  Dermatologic Nails well groomed and normal in appearance. No open wounds. No skin lesions.  Orthopedic: Normal joint ROM without pain or crepitus bilaterally. No visible deformities. Tender to palpation at the calcaneal tuber right. No pain with calcaneal squeeze right. Ankle ROM diminished range of motion right. Silfverskiold Test: positive right.   Radiographs: Taken and reviewed. No acute fractures or dislocations. No evidence of stress fracture.  Plantar heel spur present. Posterior heel spur present.   Assessment:   1. Plantar fasciitis    Plan:  Patient was evaluated and treated and all questions answered.  Plantar Fasciitis, right - XR reviewed as above.  - Educated on icing and stretching. Instructions given.  - Injection delivered to the plantar fascia as below. - DME: Plantar Fascial Brace - Pharmacologic management: None  Procedure: Injection Tendon/Ligament Location: Right plantar fascia at the glabrous junction; medial approach. Skin Prep: alcohol Injectate: 0.5 cc 0.5% marcaine plain, 0.5 cc of 1% Lidocaine, 0.5 cc kenalog 10. Disposition: Patient tolerated procedure well. Injection site dressed with a band-aid.  No follow-ups on file.

## 2020-07-12 DIAGNOSIS — H353231 Exudative age-related macular degeneration, bilateral, with active choroidal neovascularization: Secondary | ICD-10-CM | POA: Diagnosis not present

## 2020-08-07 ENCOUNTER — Encounter: Payer: Self-pay | Admitting: Podiatry

## 2020-08-07 ENCOUNTER — Other Ambulatory Visit: Payer: Self-pay

## 2020-08-07 ENCOUNTER — Ambulatory Visit (INDEPENDENT_AMBULATORY_CARE_PROVIDER_SITE_OTHER): Payer: Medicare Other | Admitting: Podiatry

## 2020-08-07 DIAGNOSIS — M7731 Calcaneal spur, right foot: Secondary | ICD-10-CM

## 2020-08-07 DIAGNOSIS — M722 Plantar fascial fibromatosis: Secondary | ICD-10-CM | POA: Diagnosis not present

## 2020-08-07 NOTE — Progress Notes (Signed)
  Subjective:  Patient ID: Carmen Buckley, female    DOB: 09/10/1951,  MRN: [id number]  Chief Complaint  Patient presents with  . Plantar Fasciitis    "its better, but still a little sore"    69 y.o. female presents with the above complaint.  Patient presents for follow-up of right plantar fasciitis.  Patient states that she is doing well.  She states that the injection helped a lot and the brace helped a lot.  She states that she is 70% better.  She still has pain when ambulating.  She denies any other acute complaints.  Review of Systems: Negative except as noted in the HPI. Denies N/V/F/Ch.  Past Medical History:  Diagnosis Date  . Anxiety   . Depression   . GERD (gastroesophageal reflux disease)   . Hypertension     Current Outpatient Medications:  .  atorvastatin (LIPITOR) 10 MG tablet, Take 10 mg by mouth daily., Disp: , Rfl:  .  azithromycin (ZITHROMAX) 250 MG tablet, TAKE 2 TABLETS BY MOUTH DAILY FOR 3 DAYS, Disp: 6 tablet, Rfl: 0 .  benzonatate (TESSALON PERLES) 100 MG capsule, Take 1-2 tabs TID prn cough, Disp: 30 capsule, Rfl: 0 .  LISINOPRIL-HYDROCHLOROTHIAZIDE PO, Take by mouth. 40/25 MG HS, Disp: , Rfl:  .  loratadine (CLARITIN) 10 MG tablet, Take 10 mg by mouth daily., Disp: , Rfl:  .  loratadine (CLARITIN) 10 MG tablet, Take 1 tablet (10 mg total) by mouth daily., Disp: 30 tablet, Rfl: 11 .  metoprolol (LOPRESSOR) 50 MG tablet, Take 50 mg by mouth daily., Disp: , Rfl:  .  omeprazole (PRILOSEC) 20 MG capsule, Take 20 mg by mouth daily., Disp: , Rfl:   Social History   Tobacco Use  Smoking Status Current Some Day Smoker  . Packs/day: 0.50  . Types: Cigarettes  Smokeless Tobacco Never Used    No Known Allergies Objective:  There were no vitals filed for this visit. There is no height or weight on file to calculate BMI. Constitutional Well developed. Well nourished.  Vascular Dorsalis pedis pulses palpable bilaterally. Posterior tibial pulses palpable  bilaterally. Capillary refill normal to all digits.  No cyanosis or clubbing noted. Pedal hair growth normal.  Neurologic Normal speech. Oriented to person, place, and time. Epicritic sensation to light touch grossly present bilaterally.  Dermatologic Nails well groomed and normal in appearance. No open wounds. No skin lesions.  Orthopedic: Normal joint ROM without pain or crepitus bilaterally. No visible deformities. Tender to palpation at the calcaneal tuber right. No pain with calcaneal squeeze right. Ankle ROM diminished range of motion right. Silfverskiold Test: positive right.   Radiographs: Taken and reviewed. No acute fractures or dislocations. No evidence of stress fracture.  Plantar heel spur present. Posterior heel spur present.   Assessment:   1. Plantar fasciitis of right foot   2. Heel spur, right    Plan:  Patient was evaluated and treated and all questions answered.  Plantar Fasciitis, right with underlying heel spur - XR reviewed as above.  - Educated on icing and stretching. Instructions given.  -Second injection delivered to the plantar fascia as below. - DME: Night splint - Pharmacologic management: None  Procedure: Injection Tendon/Ligament Location: Right plantar fascia at the glabrous junction; medial approach. Skin Prep: alcohol Injectate: 0.5 cc 0.5% marcaine plain, 0.5 cc of 1% Lidocaine, 0.5 cc kenalog 10. Disposition: Patient tolerated procedure well. Injection site dressed with a band-aid.  No follow-ups on file.

## 2020-08-10 DIAGNOSIS — H353231 Exudative age-related macular degeneration, bilateral, with active choroidal neovascularization: Secondary | ICD-10-CM | POA: Diagnosis not present

## 2020-09-04 ENCOUNTER — Ambulatory Visit: Payer: Medicare Other | Admitting: Podiatry

## 2020-09-10 DIAGNOSIS — H353231 Exudative age-related macular degeneration, bilateral, with active choroidal neovascularization: Secondary | ICD-10-CM | POA: Diagnosis not present

## 2020-10-15 DIAGNOSIS — H353231 Exudative age-related macular degeneration, bilateral, with active choroidal neovascularization: Secondary | ICD-10-CM | POA: Diagnosis not present

## 2020-10-19 ENCOUNTER — Other Ambulatory Visit: Payer: Self-pay | Admitting: *Deleted

## 2020-10-19 MED ORDER — AZITHROMYCIN 250 MG PO TABS: ORAL_TABLET | ORAL | 0 refills | Status: DC

## 2020-10-31 ENCOUNTER — Other Ambulatory Visit: Payer: Self-pay | Admitting: Internal Medicine

## 2020-10-31 DIAGNOSIS — Z1231 Encounter for screening mammogram for malignant neoplasm of breast: Secondary | ICD-10-CM

## 2020-11-06 ENCOUNTER — Ambulatory Visit
Admission: RE | Admit: 2020-11-06 | Discharge: 2020-11-06 | Disposition: A | Discharge status: Home / Self Care | Payer: Medicare Other | Source: Ambulatory Visit | Attending: Internal Medicine | Admitting: Internal Medicine

## 2020-11-06 ENCOUNTER — Other Ambulatory Visit: Payer: Self-pay

## 2020-11-06 DIAGNOSIS — Z1231 Encounter for screening mammogram for malignant neoplasm of breast: Secondary | ICD-10-CM | POA: Diagnosis not present

## 2020-12-17 DIAGNOSIS — H353212 Exudative age-related macular degeneration, right eye, with inactive choroidal neovascularization: Secondary | ICD-10-CM | POA: Diagnosis not present

## 2020-12-17 DIAGNOSIS — H353231 Exudative age-related macular degeneration, bilateral, with active choroidal neovascularization: Secondary | ICD-10-CM | POA: Diagnosis not present

## 2021-01-30 DIAGNOSIS — H353231 Exudative age-related macular degeneration, bilateral, with active choroidal neovascularization: Secondary | ICD-10-CM | POA: Diagnosis not present

## 2021-03-06 ENCOUNTER — Other Ambulatory Visit: Payer: Self-pay | Admitting: Internal Medicine

## 2021-03-11 DIAGNOSIS — H353231 Exudative age-related macular degeneration, bilateral, with active choroidal neovascularization: Secondary | ICD-10-CM | POA: Diagnosis not present

## 2021-03-28 DIAGNOSIS — H353212 Exudative age-related macular degeneration, right eye, with inactive choroidal neovascularization: Secondary | ICD-10-CM | POA: Diagnosis not present

## 2021-04-26 DIAGNOSIS — H353231 Exudative age-related macular degeneration, bilateral, with active choroidal neovascularization: Secondary | ICD-10-CM | POA: Diagnosis not present

## 2021-05-22 ENCOUNTER — Other Ambulatory Visit: Payer: Self-pay

## 2021-05-22 ENCOUNTER — Ambulatory Visit (INDEPENDENT_AMBULATORY_CARE_PROVIDER_SITE_OTHER): Payer: Medicare Other | Admitting: Internal Medicine

## 2021-05-22 ENCOUNTER — Encounter: Payer: Self-pay | Admitting: Internal Medicine

## 2021-05-22 VITALS — BP 118/80 | HR 86 | Ht 64.0 in | Wt 176.3 lb

## 2021-05-22 DIAGNOSIS — Z720 Tobacco use: Secondary | ICD-10-CM

## 2021-05-22 DIAGNOSIS — J301 Allergic rhinitis due to pollen: Secondary | ICD-10-CM

## 2021-05-22 DIAGNOSIS — E669 Obesity, unspecified: Secondary | ICD-10-CM

## 2021-05-22 DIAGNOSIS — I7789 Other specified disorders of arteries and arterioles: Secondary | ICD-10-CM | POA: Insufficient documentation

## 2021-05-22 DIAGNOSIS — I10 Essential (primary) hypertension: Secondary | ICD-10-CM

## 2021-05-22 DIAGNOSIS — J0100 Acute maxillary sinusitis, unspecified: Secondary | ICD-10-CM | POA: Diagnosis not present

## 2021-05-22 NOTE — Assessment & Plan Note (Signed)
Swelling of the leg is gone.  There is no pedal edema Bevelyn Buckles' sign is negative peripheral pulses are present

## 2021-05-22 NOTE — Assessment & Plan Note (Signed)

## 2021-05-22 NOTE — Progress Notes (Signed)
Established Patient Office Visit  Subjective:  Patient ID: Carmen Buckley, female    DOB: 18-Oct-1951  Age: 70 y.o. MRN: [id number]  CC:  Chief Complaint  Patient presents with   Leg Swelling    Patient complains of leg swelling in both legs. Swelling started     HPI  Carmen Buckley presents for swelling of the legs, there is no fever chills tenderness in the leg swelling has subsided there is no evidence of any infection patient continues to smoke denies any history of palpitation, she does not drink.  Past Medical History:  Diagnosis Date   Anxiety    Depression    GERD (gastroesophageal reflux disease)    Hypertension     Past Surgical History:  Procedure Laterality Date   ABDOMINAL HYSTERECTOMY     CATARACT EXTRACTION W/PHACO Right 05/26/2017   Procedure: CATARACT EXTRACTION PHACO AND INTRAOCULAR LENS PLACEMENT ([place]);  Surgeon: Birder Robson, MD;  Location: [facility] ORS;  Service: Ophthalmology;  Laterality: Right;  [place] 00:32 AP% 14.3 CDE 4.64 Fluid pack lot # [id number]   TENDON REPAIR Left    ARM    History reviewed. No pertinent family history.  Social History   Socioeconomic History   Marital status: Single    Spouse name: Not on file   Number of children: Not on file   Years of education: Not on file   Highest education level: Not on file  Occupational History   Not on file  Tobacco Use   Smoking status: Some Days    Packs/day: 0.50    Pack years: 0.00    Types: Cigarettes   Smokeless tobacco: Never  Substance and Sexual Activity   Alcohol use: No   Drug use: No   Sexual activity: Not on file  Other Topics Concern   Not on file  Social History Narrative   Not on file   Social Determinants of Health   Financial Resource Strain: Not on file  Food Insecurity: Not on file  Transportation Needs: Not on file  Physical Activity: Not on file  Stress: Not on file  Social Connections: Not on file  Intimate Partner Violence: Not on file      Current Outpatient Medications:    atorvastatin (LIPITOR) 10 MG tablet, Take 10 mg by mouth daily., Disp: , Rfl:    LISINOPRIL-HYDROCHLOROTHIAZIDE PO, Take by mouth. 40/25 MG HS, Disp: , Rfl:    loratadine (CLARITIN) 10 MG tablet, Take 1 tablet (10 mg total) by mouth daily., Disp: 30 tablet, Rfl: 11   metoprolol tartrate (LOPRESSOR) 50 MG tablet, TAKE 1 TABLET BY MOUTH DAILY, Disp: 90 tablet, Rfl: 3   omeprazole (PRILOSEC) 20 MG capsule, TAKE 1 TABLET BY MOUTH DAILY, Disp: 90 capsule, Rfl: 3   No Known Allergies  ROS Review of Systems  Constitutional: Negative.   HENT: Negative.    Eyes: Negative.   Respiratory: Negative.    Cardiovascular: Negative.   Gastrointestinal: Negative.   Endocrine: Negative.   Genitourinary: Negative.   Musculoskeletal: Negative.   Skin: Negative.   Allergic/Immunologic: Negative.   Neurological: Negative.   Hematological: Negative.   Psychiatric/Behavioral: Negative.    All other systems reviewed and are negative.    Objective:    Physical Exam Vitals reviewed.  Constitutional:      Appearance: Normal appearance.  HENT:     Mouth/Throat:     Mouth: Mucous membranes are moist.  Eyes:     Pupils: Pupils are equal, round, and reactive  to light.  Neck:     Vascular: No carotid bruit.  Cardiovascular:     Rate and Rhythm: Normal rate and regular rhythm.     Pulses: Normal pulses.     Heart sounds: Normal heart sounds.  Pulmonary:     Effort: Pulmonary effort is normal.     Breath sounds: Normal breath sounds.  Abdominal:     General: Bowel sounds are normal.     Palpations: Abdomen is soft. There is no hepatomegaly, splenomegaly or mass.     Tenderness: There is no abdominal tenderness.     Hernia: No hernia is present.  Musculoskeletal:        General: No tenderness.     Cervical back: Neck supple.     Right lower leg: No edema.     Left lower leg: No edema.  Skin:    Findings: No rash.  Neurological:     Mental Status: She  is alert and oriented to person, place, and time.     Motor: No weakness.  Psychiatric:        Mood and Affect: Mood and affect normal.        Behavior: Behavior normal.    BP 118/80   Pulse 86   Ht 5\' 4"  (1.626 m)   Wt 176 lb 4.8 oz (80 kg)   BMI 30.26 kg/m  Wt Readings from Last 3 Encounters:  05/22/21 176 lb 4.8 oz (80 kg)  07/02/20 179 lb 1.6 oz (81.2 kg)  09/20/18 157 lb (71.2 kg)     Health Maintenance Due  Topic Date Due   Hepatitis C Screening  Never done   COLONOSCOPY (Pts 45-95yrs Insurance coverage will need to be confirmed)  Never done   DEXA SCAN  Never done   PNA vac Low Risk Adult (1 of 2 - PCV13) 03/18/2017   Zoster Vaccines- Shingrix (2 of 2) 08/22/2017   COVID-19 Vaccine (2 - Pfizer series) 01/16/2021    There are no preventive care reminders to display for this patient.  No results found for: TSH Lab Results  Component Value Date   WBC 11.3 (H) 03/26/2015   HGB 14.3 03/26/2015   HCT 43.3 03/26/2015   MCV 94.4 03/26/2015   PLT 237 03/26/2015   Lab Results  Component Value Date   NA 140 03/26/2015   K 3.3 (L) 03/26/2015   CO2 25 03/26/2015   GLUCOSE 94 03/26/2015   BUN 12 03/26/2015   CREATININE 0.92 03/26/2015   CALCIUM 9.5 03/26/2015   ANIONGAP 9 03/26/2015   Lab Results  Component Value Date   CHOL 218 (H) 05/06/2012   Lab Results  Component Value Date   HDL 52 05/06/2012   Lab Results  Component Value Date   LDLCALC 151 (H) 05/06/2012   Lab Results  Component Value Date   TRIG 75 05/06/2012   No results found for: CHOLHDL No results found for: HGBA1C    Assessment & Plan:   Problem List Items Addressed This Visit       Cardiovascular and Mediastinum   Essential hypertension - Primary     Patient denies any chest pain or shortness of breath there is no history of palpitation or paroxysmal nocturnal dyspnea   patient was advised to follow low-salt low-cholesterol diet    ideally I want to keep systolic blood pressure  below 130 mmHg, patient was asked to check blood pressure one times a week and give me a report on that.  Patient  will be follow-up in 3 months  or earlier as needed, patient will call me back for any change in the cardiovascular symptoms Patient was advised to buy a book from local bookstore concerning blood pressure and read several chapters  every day.  This will be supplemented by some of the material we will give him from the office.  Patient should also utilize other resources like YouTube and Internet to learn more about the blood pressure and the diet.       Balloon like swelling in an artery of the leg (HCC)    Swelling of the leg is gone.  There is no pedal edema Bevelyn Buckles' sign is negative peripheral pulses are present         Respiratory   Subacute maxillary sinusitis    Chronic problem the patient, the hoarseness voice.  Is due to reflux or postnasal drip, will refer to ENT.       Seasonal allergic rhinitis due to pollen    Take Claritin 5 mg p.o. as needed         Other   Obesity (BMI 30-39.9)    - I encouraged the patient to lose weight.  - I educated them on making healthy dietary choices including eating more fruits and vegetables and less fried foods. - I encouraged the patient to exercise more, and educated on the benefits of exercise including weight loss, diabetes prevention, and hypertension prevention.   Dietary counseling with a registered dietician  Referral to a weight management support group (e.g. Weight Watchers, Overeaters Anonymous)  If your BMI is greater than 29 or you have gained more than 15 pounds you should work on weight loss.  Attend a healthy cooking class        Tobacco abuse    - I instructed the patient to stop smoking and provided them with smoking cessation materials.  - I informed the patient that smoking puts them at increased risk for cancer, COPD, hypertension, and more.  - Informed the patient to seek help if they begin to have  trouble breathing, develop chest pain, start to cough up blood, feel faint, or pass out.        No orders of the defined types were placed in this encounter.   Follow-up: No follow-ups on file.    Cletis Athens, MD

## 2021-05-22 NOTE — Assessment & Plan Note (Signed)
Take Claritin 5 mg p.o. as needed

## 2021-05-22 NOTE — Assessment & Plan Note (Signed)

## 2021-05-22 NOTE — Assessment & Plan Note (Signed)
-   I instructed the patient to stop smoking and provided them with smoking cessation materials.  - I informed the patient that smoking puts them at increased risk for cancer, COPD, hypertension, and more.  - Informed the patient to seek help if they begin to have trouble breathing, develop chest pain, start to cough up blood, feel faint, or pass out.  

## 2021-05-22 NOTE — Assessment & Plan Note (Signed)
Chronic problem the patient, the hoarseness voice.  Is due to reflux or postnasal drip, will refer to ENT.

## 2021-05-27 ENCOUNTER — Other Ambulatory Visit: Payer: Self-pay

## 2021-05-27 ENCOUNTER — Other Ambulatory Visit (INDEPENDENT_AMBULATORY_CARE_PROVIDER_SITE_OTHER): Payer: Medicare Other

## 2021-05-27 DIAGNOSIS — E669 Obesity, unspecified: Secondary | ICD-10-CM

## 2021-05-27 DIAGNOSIS — I10 Essential (primary) hypertension: Secondary | ICD-10-CM | POA: Diagnosis not present

## 2021-05-27 LAB — COMPLETE METABOLIC PANEL WITH GFR
AG Ratio: 1.6 (calc) (ref 1.0–2.5)
ALT: 16 U/L (ref 6–29)
AST: 24 U/L (ref 10–35)
Albumin: 4.2 g/dL (ref 3.6–5.1)
Alkaline phosphatase (APISO): 60 U/L (ref 37–153)
BUN: 13 mg/dL (ref 7–25)
CO2: 20 mmol/L (ref 20–32)
Calcium: 9.7 mg/dL (ref 8.6–10.4)
Chloride: 110 mmol/L (ref 98–110)
Creat: 0.89 mg/dL (ref 0.50–1.05)
Globulin: 2.6 g/dL (calc) (ref 1.9–3.7)
Glucose, Bld: 70 mg/dL (ref 65–99)
Potassium: 5 mmol/L (ref 3.5–5.3)
Sodium: 144 mmol/L (ref 135–146)
Total Bilirubin: 0.4 mg/dL (ref 0.2–1.2)
Total Protein: 6.8 g/dL (ref 6.1–8.1)
eGFR: 70 mL/min/{1.73_m2} (ref >=60)

## 2021-05-27 LAB — LIPID PANEL
Cholesterol: 224 mg/dL — ABNORMAL HIGH (ref <200)
HDL: 58 mg/dL (ref >=50)
LDL Cholesterol (Calc): 148 mg/dL (calc) — ABNORMAL HIGH
Non-HDL Cholesterol (Calc): 166 mg/dL (calc) — ABNORMAL HIGH (ref <130)
Total CHOL/HDL Ratio: 3.9 (calc) (ref <5.0)
Triglycerides: 81 mg/dL (ref <150)

## 2021-05-27 LAB — CBC WITH DIFFERENTIAL/PLATELET
Absolute Monocytes: 708 cells/uL (ref 200–950)
Basophils Absolute: 64 cells/uL (ref 0–200)
Basophils Relative: 0.7 %
Eosinophils Absolute: 322 cells/uL (ref 15–500)
Eosinophils Relative: 3.5 %
HCT: 42.1 % (ref 35.0–45.0)
Hemoglobin: 14 g/dL (ref 11.7–15.5)
Lymphs Abs: 3818 cells/uL (ref 850–3900)
MCH: 32 pg (ref 27.0–33.0)
MCHC: 33.3 g/dL (ref 32.0–36.0)
MCV: 96.3 fL (ref 80.0–100.0)
MPV: 11.1 fL (ref 7.5–12.5)
Monocytes Relative: 7.7 %
Neutro Abs: 4287 cells/uL (ref 1500–7800)
Neutrophils Relative %: 46.6 %
Platelets: 234 10*3/uL (ref 140–400)
RBC: 4.37 10*6/uL (ref 3.80–5.10)
RDW: 13 % (ref 11.0–15.0)
Total Lymphocyte: 41.5 %
WBC: 9.2 10*3/uL (ref 3.8–10.8)

## 2021-05-27 LAB — TSH: TSH: 0.78 mIU/L (ref 0.40–4.50)

## 2021-06-06 ENCOUNTER — Other Ambulatory Visit: Payer: Self-pay | Admitting: *Deleted

## 2021-06-06 MED ORDER — CIPROFLOXACIN HCL 500 MG PO TABS: 500.0000 mg | ORAL_TABLET | Freq: Two times a day (BID) | ORAL | 0 refills | Status: AC

## 2021-06-20 DIAGNOSIS — H353231 Exudative age-related macular degeneration, bilateral, with active choroidal neovascularization: Secondary | ICD-10-CM | POA: Diagnosis not present

## 2021-08-08 DIAGNOSIS — H353231 Exudative age-related macular degeneration, bilateral, with active choroidal neovascularization: Secondary | ICD-10-CM | POA: Diagnosis not present

## 2021-08-09 ENCOUNTER — Ambulatory Visit (INDEPENDENT_AMBULATORY_CARE_PROVIDER_SITE_OTHER): Payer: Medicare Other | Admitting: *Deleted

## 2021-08-09 ENCOUNTER — Other Ambulatory Visit: Payer: Self-pay | Admitting: *Deleted

## 2021-08-09 DIAGNOSIS — Z0000 Encounter for general adult medical examination without abnormal findings: Secondary | ICD-10-CM

## 2021-08-09 MED ORDER — LISINOPRIL-HYDROCHLOROTHIAZIDE 20-25 MG PO TABS: 1.0000 | ORAL_TABLET | Freq: Every day | ORAL | 3 refills | Status: DC

## 2021-08-09 NOTE — Progress Notes (Signed)
I have reviewed this visit and agree with the documentation.   

## 2021-08-09 NOTE — Progress Notes (Signed)
Subjective:   Carmen Buckley is a 70 y.o. female who presents for an Initial Medicare Annual Wellness Visit.  I discussed the limitations of evaluation and management by telemedicine and the availability of in person appointments. Patient expressed understanding and agreed to proceed.   Visit performed using audio  Patient:home Provider:home    Review of Systems    Defer to provider Cardiac Risk Factors include: advanced age (>60men, >64 women);hypertension     Objective:    There were no vitals filed for this visit. There is no height or weight on file to calculate BMI.  Advanced Directives 08/09/2021 09/20/2018 07/03/2017 06/27/2017 03/26/2015  Does Patient Have a Medical Advance Directive? No No No No No  Would patient like information on creating a medical advance directive? No - Patient declined No - Patient declined No - Patient declined - No - patient declined information    Current Medications (verified) Outpatient Encounter Medications as of 08/09/2021  Medication Sig   atorvastatin (LIPITOR) 10 MG tablet Take 10 mg by mouth daily.   LISINOPRIL-HYDROCHLOROTHIAZIDE PO Take by mouth. 40/25 MG HS   loratadine (CLARITIN) 10 MG tablet Take 1 tablet (10 mg total) by mouth daily.   metoprolol tartrate (LOPRESSOR) 50 MG tablet TAKE 1 TABLET BY MOUTH DAILY   omeprazole (PRILOSEC) 20 MG capsule TAKE 1 TABLET BY MOUTH DAILY   No facility-administered encounter medications on file as of 08/09/2021.    Allergies (verified) Patient has no known allergies.   History: Past Medical History:  Diagnosis Date   Anxiety    Depression    GERD (gastroesophageal reflux disease)    Hypertension    Past Surgical History:  Procedure Laterality Date   ABDOMINAL HYSTERECTOMY     CATARACT EXTRACTION W/PHACO Right 05/26/2017   Procedure: CATARACT EXTRACTION PHACO AND INTRAOCULAR LENS PLACEMENT ([place]);  Surgeon: Birder Robson, MD;  Location: [facility] ORS;  Service: Ophthalmology;   Laterality: Right;  [place] 00:32 AP% 14.3 CDE 4.64 Fluid pack lot # [id number]   TENDON REPAIR Left    ARM   History reviewed. No pertinent family history. Social History   Socioeconomic History   Marital status: Single    Spouse name: Not on file   Number of children: Not on file   Years of education: Not on file   Highest education level: Not on file  Occupational History   Not on file  Tobacco Use   Smoking status: Some Days    Packs/day: 0.50    Types: Cigarettes   Smokeless tobacco: Never  Substance and Sexual Activity   Alcohol use: No   Drug use: No   Sexual activity: Not on file  Other Topics Concern   Not on file  Social History Narrative   Not on file   Social Determinants of Health   Financial Resource Strain: Low Risk    Difficulty of Paying Living Expenses: Not very hard  Food Insecurity: Not on file  Transportation Needs: No Transportation Needs   Lack of Transportation (Medical): No   Lack of Transportation (Non-Medical): No  Physical Activity: Insufficiently Active   Days of Exercise per Week: 3 days   Minutes of Exercise per Session: 30 min  Stress: No Stress Concern Present   Feeling of Stress : Not at all  Social Connections: Moderately Integrated   Frequency of Communication with Friends and Family: More than three times a week   Frequency of Social Gatherings with Friends and Family: More than three times a week  Attends Religious Services: More than 4 times per year   Active Member of Clubs or Organizations: Yes   Attends Archivist Meetings: More than 4 times per year   Marital Status: Widowed    Tobacco Counseling Ready to quit: Not Answered Counseling given: Not Answered   Clinical Intake:  Pre-visit preparation completed: Yes  Pain : No/denies pain     Diabetes: No  How often do you need to have someone help you when you read instructions, pamphlets, or other written materials from your doctor or pharmacy?: 1 -  Never What is the last grade level you completed in school?: 12  Diabetic?No  Interpreter Needed?: No  Information entered by :: Lacretia Nicks, [place]   Activities of Daily Living In your present state of health, do you have any difficulty performing the following activities: 08/09/2021  Hearing? N  Vision? N  Difficulty concentrating or making decisions? N  Walking or climbing stairs? N  Dressing or bathing? N  Doing errands, shopping? N  Preparing Food and eating ? N  Using the Toilet? N  In the past six months, have you accidently leaked urine? N  Do you have problems with loss of bowel control? N  Managing your Medications? N  Managing your Finances? N  Housekeeping or managing your Housekeeping? N  Some recent data might be hidden    Patient Care Team: Cletis Athens, MD as PCP - General (Internal Medicine)  Indicate any recent Medical Services you may have received from other than [facility] providers in the past year (date may be approximate).     Assessment:   This is a routine wellness examination for College.  Hearing/Vision screen No results found.  Dietary issues and exercise activities discussed: Current Exercise Habits: Home exercise routine, Time (Minutes): 30, Frequency (Times/Week): 2, Weekly Exercise (Minutes/Week): 60, Exercise limited by: None identified   Goals Addressed   None    Depression Screen PHQ 2/9 Scores 08/09/2021 05/22/2021  PHQ - 2 Score 0 0    Fall Risk Fall Risk  08/09/2021 05/22/2021  Falls in the past year? 0 0  Number falls in past yr: 0 0  Injury with Fall? 0 0  Risk for fall due to : - No Fall Risks  Follow up - Falls evaluation completed    [place]:  Any stairs in or around the home? Yes  If so, are there any without handrails? No  Home free of loose throw rugs in walkways, pet beds, electrical cords, etc? No  Adequate lighting in your home to reduce risk of falls? No   ASSISTIVE DEVICES  UTILIZED TO PREVENT FALLS:  Life alert? No  Use of a cane, walker or w/c? No  Grab bars in the bathroom? No  Shower chair or bench in shower? No  Elevated toilet seat or a handicapped toilet? No   TIMED UP AND GO:  Was the test performed? No .  Length of time to ambulate: NA  Gait steady and fast without use of assistive device  Cognitive Function: MMSE - Mini Mental State Exam 08/09/2021  Orientation to time 5  Orientation to Place 5  Registration 3  Attention/ Calculation 5  Recall 3  Language- name 2 objects 2  Language- repeat 1  Language- follow 3 step command 3  Language- read & follow direction 1  Write a sentence 1  Copy design 0  Total score 29     6CIT Screen 08/09/2021  What Year? 0 points  What month? 0 points  What time? 0 points  Count back from 20 0 points  Months in reverse 0 points  Repeat phrase 0 points  Total Score 0    Immunizations Immunization History  Administered Date(s) Administered   PFIZER(Purple Top)SARS-COV-2 Vaccination 12/26/2020   Pneumococcal Polysaccharide-23 07/20/2014   Tdap 07/03/2017   Zoster Recombinat (Shingrix) 06/27/2017    TDAP status: Up to date  Flu Vaccine status: Up to date  Pneumococcal vaccine status: Up to date  Covid-19 vaccine status: Completed vaccines  Qualifies for Shingles Vaccine? No   Zostavax completed Yes   Shingrix Completed?: Yes  Screening Tests Health Maintenance  Topic Date Due   Hepatitis C Screening  Never done   COLONOSCOPY (Pts 45-42yrs Insurance coverage will need to be confirmed)  Never done   DEXA SCAN  Never done   Zoster Vaccines- Shingrix (2 of 2) 08/22/2017   COVID-19 Vaccine (2 - Pfizer series) 01/16/2021   INFLUENZA VACCINE  Never done   MAMMOGRAM  11/06/2022   TETANUS/TDAP  07/04/2027   HPV VACCINES  Aged Out    Health Maintenance  Health Maintenance Due  Topic Date Due   Hepatitis C Screening  Never done   COLONOSCOPY (Pts 45-60yrs Insurance coverage will  need to be confirmed)  Never done   DEXA SCAN  Never done   Zoster Vaccines- Shingrix (2 of 2) 08/22/2017   COVID-19 Vaccine (2 - Pfizer series) 01/16/2021   INFLUENZA VACCINE  Never done    Colorectal cancer screening: Type of screening: Colonoscopy. Completed 2019. Repeat every 5 years  Mammogram status: Completed 10/17/2020. Repeat every year  Bone Density status: Ordered today. Pt provided with contact info and advised to call to schedule appt.  Lung Cancer Screening: (Low Dose CT Chest recommended if Age 29-80 years, 30 pack-year currently smoking OR have quit w/in 15years.) does not qualify.   Lung Cancer Screening Referral: No  Additional Screening:  Hepatitis C Screening: does qualify; Completed No  Vision Screening: Recommended annual ophthalmology exams for early detection of glaucoma and other disorders of the eye. Is the patient up to date with their annual eye exam?  Yes  Who is the provider or what is the name of the office in which the patient attends annual eye exams? [facility]  If pt is not established with a provider, would they like to be referred to a provider to establish care?  Already established  .   Dental Screening: Recommended annual dental exams for proper oral hygiene  Community Resource Referral / Chronic Care Management: CRR required this visit?  No   CCM required this visit?  No      Plan:     I have personally reviewed and noted the following in the patient's chart:   Medical and social history Use of alcohol, tobacco or illicit drugs  Current medications and supplements including opioid prescriptions. Patient is not currently taking opioid prescriptions. Functional ability and status Nutritional status Physical activity Advanced directives List of other physicians Hospitalizations, surgeries, and ER visits in previous 12 months Vitals Screenings to include cognitive, depression, and falls Referrals and appointments  In  addition, I have reviewed and discussed with patient certain preventive protocols, quality metrics, and best practice recommendations. A written personalized care plan for preventive services as well as general preventive health recommendations were provided to patient.     Lacretia Nicks, [place]   08/09/2021   Nurse Notes:  Ms. Springsteen ,  Thank you for taking time to come for your Medicare Wellness Visit. I appreciate your ongoing commitment to your health goals. Please review the following plan we discussed and let me know if I can assist you in the future.   These are the goals we discussed:  Goals   None     This is a list of the screening recommended for you and due dates:  Health Maintenance  Topic Date Due   Hepatitis C Screening: USPSTF Recommendation to screen - Ages 69-79 yo.  Never done   Colon Cancer Screening  Never done   DEXA scan (bone density measurement)  Never done   Zoster (Shingles) Vaccine (2 of 2) 08/22/2017   COVID-19 Vaccine (2 - Pfizer series) 01/16/2021   Flu Shot  Never done   Mammogram  11/06/2022   Tetanus Vaccine  07/04/2027   HPV Vaccine  Aged Out      Time spent with patient 30 min

## 2021-09-13 DIAGNOSIS — H353212 Exudative age-related macular degeneration, right eye, with inactive choroidal neovascularization: Secondary | ICD-10-CM | POA: Diagnosis not present

## 2021-10-07 ENCOUNTER — Ambulatory Visit: Payer: Medicare Other | Admitting: Internal Medicine

## 2021-10-07 DIAGNOSIS — H353231 Exudative age-related macular degeneration, bilateral, with active choroidal neovascularization: Secondary | ICD-10-CM | POA: Diagnosis not present

## 2021-10-09 ENCOUNTER — Ambulatory Visit: Payer: Medicare Other | Admitting: Internal Medicine

## 2021-10-16 ENCOUNTER — Ambulatory Visit (INDEPENDENT_AMBULATORY_CARE_PROVIDER_SITE_OTHER): Payer: Medicare Other | Admitting: Internal Medicine

## 2021-10-16 ENCOUNTER — Other Ambulatory Visit: Payer: Self-pay

## 2021-10-16 ENCOUNTER — Encounter: Payer: Self-pay | Admitting: Internal Medicine

## 2021-10-16 VITALS — BP 146/89 | HR 82 | Ht 64.0 in | Wt 176.9 lb

## 2021-10-16 DIAGNOSIS — I10 Essential (primary) hypertension: Secondary | ICD-10-CM | POA: Diagnosis not present

## 2021-10-16 DIAGNOSIS — E669 Obesity, unspecified: Secondary | ICD-10-CM

## 2021-10-16 DIAGNOSIS — J0100 Acute maxillary sinusitis, unspecified: Secondary | ICD-10-CM

## 2021-10-16 DIAGNOSIS — J301 Allergic rhinitis due to pollen: Secondary | ICD-10-CM | POA: Diagnosis not present

## 2021-10-16 DIAGNOSIS — M5442 Lumbago with sciatica, left side: Secondary | ICD-10-CM | POA: Diagnosis not present

## 2021-10-16 DIAGNOSIS — Z720 Tobacco use: Secondary | ICD-10-CM | POA: Diagnosis not present

## 2021-10-16 MED ORDER — LORATADINE 10 MG PO TABS: 10.0000 mg | ORAL_TABLET | Freq: Once | ORAL | Status: DC

## 2021-10-16 MED ORDER — AZITHROMYCIN 250 MG PO TABS: ORAL_TABLET | ORAL | 0 refills | Status: DC

## 2021-10-16 NOTE — Assessment & Plan Note (Signed)
Start patient on Z-Pak

## 2021-10-16 NOTE — Addendum Note (Signed)
Addended by: Alois Cliche on: 10/16/2021 10:16 AM   Modules accepted: Orders

## 2021-10-16 NOTE — Assessment & Plan Note (Signed)
-   I instructed the patient to stop smoking and provided them with smoking cessation materials.  - I informed the patient that smoking puts them at increased risk for cancer, COPD, hypertension, and more.  - Informed the patient to seek help if they begin to have trouble breathing, develop chest pain, start to cough up blood, feel faint, or pass out.  

## 2021-10-16 NOTE — Assessment & Plan Note (Signed)
Blood pressure is stable 

## 2021-10-16 NOTE — Assessment & Plan Note (Signed)

## 2021-10-16 NOTE — Progress Notes (Signed)
Established Patient Office Visit  Subjective:  Patient ID: Carmen Buckley, female    DOB: [birth year]  Age: 70 y.o. MRN: [id number]  CC:  Chief Complaint  Patient presents with   Back Pain    Left sided back pain runs down into hip and leg. Patient started x 1 month ago.     Back Pain   Carmen Buckley presents for lt hip pain, sinus pain  Past Medical History:  Diagnosis Date   Anxiety    Depression    GERD (gastroesophageal reflux disease)    Hypertension     Past Surgical History:  Procedure Laterality Date   ABDOMINAL HYSTERECTOMY     CATARACT EXTRACTION W/PHACO Right 05/26/2017   Procedure: CATARACT EXTRACTION PHACO AND INTRAOCULAR LENS PLACEMENT ([place]);  Surgeon: Birder Robson, MD;  Location: [facility] ORS;  Service: Ophthalmology;  Laterality: Right;  [place] 00:32 AP% 14.3 CDE 4.64 Fluid pack lot # [id number]   TENDON REPAIR Left    ARM    History reviewed. No pertinent family history.  Social History   Socioeconomic History   Marital status: Single    Spouse name: Not on file   Number of children: Not on file   Years of education: Not on file   Highest education level: Not on file  Occupational History   Not on file  Tobacco Use   Smoking status: Some Days    Packs/day: 0.50    Types: Cigarettes   Smokeless tobacco: Never  Substance and Sexual Activity   Alcohol use: No   Drug use: No   Sexual activity: Not on file  Other Topics Concern   Not on file  Social History Narrative   Not on file   Social Determinants of Health   Financial Resource Strain: Low Risk    Difficulty of Paying Living Expenses: Not very hard  Food Insecurity: Not on file  Transportation Needs: No Transportation Needs   Lack of Transportation (Medical): No   Lack of Transportation (Non-Medical): No  Physical Activity: Insufficiently Active   Days of Exercise per Week: 3 days   Minutes of Exercise per Session: 30 min  Stress: No Stress Concern Present   Feeling of Stress :  Not at all  Social Connections: Moderately Integrated   Frequency of Communication with Friends and Family: More than three times a week   Frequency of Social Gatherings with Friends and Family: More than three times a week   Attends Religious Services: More than 4 times per year   Active Member of [employer] or Organizations: Yes   Attends Archivist Meetings: More than 4 times per year   Marital Status: Widowed  Human resources officer Violence: Not At Risk   Fear of Current or Ex-Partner: No   Emotionally Abused: No   Physically Abused: No   Sexually Abused: No     Current Outpatient Medications:    atorvastatin (LIPITOR) 10 MG tablet, Take 10 mg by mouth daily., Disp: , Rfl:    lisinopril-hydrochlorothiazide (ZESTORETIC) 20-25 MG tablet, Take 1 tablet by mouth daily. 40/25 MG HS, Disp: 90 tablet, Rfl: 3   loratadine (CLARITIN) 10 MG tablet, Take 1 tablet (10 mg total) by mouth daily., Disp: 30 tablet, Rfl: 11   metoprolol tartrate (LOPRESSOR) 50 MG tablet, TAKE 1 TABLET BY MOUTH DAILY, Disp: 90 tablet, Rfl: 3   omeprazole (PRILOSEC) 20 MG capsule, TAKE 1 TABLET BY MOUTH DAILY, Disp: 90 capsule, Rfl: 3   No Known Allergies  ROS  Review of Systems  Constitutional:  Positive for fatigue.  HENT: Negative.    Eyes: Negative.   Respiratory: Negative.    Cardiovascular: Negative.   Gastrointestinal: Negative.   Endocrine: Negative.   Genitourinary: Negative.   Musculoskeletal:  Positive for back pain.  Skin: Negative.   Allergic/Immunologic: Negative.   Neurological: Negative.   Hematological: Negative.   Psychiatric/Behavioral: Negative.    All other systems reviewed and are negative.    Objective:    Physical Exam Vitals reviewed.  Constitutional:      Appearance: Normal appearance.  HENT:     Mouth/Throat:     Mouth: Mucous membranes are moist.  Eyes:     Pupils: Pupils are equal, round, and reactive to light.  Neck:     Vascular: No carotid bruit.   Cardiovascular:     Rate and Rhythm: Normal rate and regular rhythm.     Pulses: Normal pulses.     Heart sounds: Normal heart sounds.  Pulmonary:     Effort: Pulmonary effort is normal.     Breath sounds: Normal breath sounds.  Abdominal:     General: Bowel sounds are normal.     Palpations: Abdomen is soft. There is no hepatomegaly, splenomegaly or mass.     Tenderness: There is no abdominal tenderness.     Hernia: No hernia is present.  Musculoskeletal:        General: No tenderness.     Cervical back: Neck supple.     Right lower leg: No edema.     Left lower leg: No edema.     Comments: Tender lower back  Skin:    Findings: No rash.  Neurological:     Mental Status: She is alert and oriented to person, place, and time.     Motor: No weakness.  Psychiatric:        Mood and Affect: Mood and affect normal.        Behavior: Behavior normal.    BP (!) 146/89   Pulse 82   Ht _0  (1.626 m)   Wt 176 lb 14.4 oz (80.2 kg)   BMI 30.36 kg/m  Wt Readings from Last 3 Encounters:  10/16/21 176 lb 14.4 oz (80.2 kg)  05/22/21 176 lb 4.8 oz (80 kg)  07/02/20 179 lb 1.6 oz (81.2 kg)     Health Maintenance Due  Topic Date Due   Hepatitis C Screening  Never done   COLONOSCOPY (Pts 45-64yr Insurance coverage will need to be confirmed)  Never done   Pneumonia Vaccine 69 Years old (2 - PCV) 07/21/2015   DEXA SCAN  Never done   Zoster Vaccines- Shingrix (2 of 2) 08/22/2017    There are no preventive care reminders to display for this patient.  Lab Results  Component Value Date   TSH 0.78 05/27/2021   Lab Results  Component Value Date   WBC 9.2 05/27/2021   HGB 14.0 05/27/2021   HCT 42.1 05/27/2021   MCV 96.3 05/27/2021   PLT 234 05/27/2021   Lab Results  Component Value Date   NA 144 05/27/2021   K 5.0 05/27/2021   CO2 20 05/27/2021   GLUCOSE 70 05/27/2021   BUN 13 05/27/2021   CREATININE 0.89 05/27/2021   BILITOT 0.4 05/27/2021   AST 24 05/27/2021   ALT 16  05/27/2021   PROT 6.8 05/27/2021   CALCIUM 9.7 05/27/2021   ANIONGAP 9 03/26/2015   EGFR 70 05/27/2021   Lab Results  Component  Value Date   CHOL 224 (H) 05/27/2021   Lab Results  Component Value Date   HDL 58 05/27/2021   Lab Results  Component Value Date   LDLCALC 148 (H) 05/27/2021   Lab Results  Component Value Date   TRIG 81 05/27/2021   Lab Results  Component Value Date   CHOLHDL 3.9 05/27/2021   No results found for: HGBA1C    Assessment & Plan:   Problem List Items Addressed This Visit       Cardiovascular and Mediastinum   Essential hypertension - Primary    Blood pressure is stable        Respiratory   Subacute maxillary sinusitis    Start patient on Z-Pak      Seasonal allergic rhinitis due to pollen    Take Claritin 5 mg daily        Other   Obesity (BMI 30-39.9)    - I encouraged the patient to lose weight.  - I educated them on making healthy dietary choices including eating more fruits and vegetables and less fried foods. - I encouraged the patient to exercise more, and educated on the benefits of exercise including weight loss, diabetes prevention, and hypertension prevention.   Dietary counseling with a registered dietician  Referral to a weight management support group (e.g. Weight Watchers, Overeaters Anonymous)  If your BMI is greater than 29 or you have gained more than 15 pounds you should work on weight loss.  Attend a healthy cooking class       Tobacco abuse    - I instructed the patient to stop smoking and provided them with smoking cessation materials.  - I informed the patient that smoking puts them at increased risk for cancer, COPD, hypertension, and more.  - Informed the patient to seek help if they begin to have trouble breathing, develop chest pain, start to cough up blood, feel faint, or pass out.       No orders of the defined types were placed in this encounter.   Follow-up: No follow-ups on file.     Cletis Athens, MD

## 2021-10-16 NOTE — Assessment & Plan Note (Signed)
Take Claritin 5 mg daily

## 2021-10-17 ENCOUNTER — Other Ambulatory Visit: Payer: Self-pay

## 2021-10-17 DIAGNOSIS — J301 Allergic rhinitis due to pollen: Secondary | ICD-10-CM

## 2021-10-17 MED ORDER — AZITHROMYCIN 250 MG PO TABS: ORAL_TABLET | ORAL | 0 refills | Status: AC

## 2021-11-01 DIAGNOSIS — H353212 Exudative age-related macular degeneration, right eye, with inactive choroidal neovascularization: Secondary | ICD-10-CM | POA: Diagnosis not present

## 2021-12-02 DIAGNOSIS — H35372 Puckering of macula, left eye: Secondary | ICD-10-CM | POA: Diagnosis not present

## 2021-12-02 DIAGNOSIS — H353212 Exudative age-related macular degeneration, right eye, with inactive choroidal neovascularization: Secondary | ICD-10-CM | POA: Diagnosis not present

## 2022-01-08 DIAGNOSIS — H353212 Exudative age-related macular degeneration, right eye, with inactive choroidal neovascularization: Secondary | ICD-10-CM | POA: Diagnosis not present

## 2022-01-21 ENCOUNTER — Ambulatory Visit: Payer: Medicare Other | Admitting: Podiatry

## 2022-01-23 ENCOUNTER — Other Ambulatory Visit: Payer: Self-pay

## 2022-01-23 ENCOUNTER — Ambulatory Visit (INDEPENDENT_AMBULATORY_CARE_PROVIDER_SITE_OTHER): Payer: Medicare Other

## 2022-01-23 ENCOUNTER — Encounter: Payer: Self-pay | Admitting: Podiatry

## 2022-01-23 ENCOUNTER — Ambulatory Visit (INDEPENDENT_AMBULATORY_CARE_PROVIDER_SITE_OTHER): Payer: Medicare Other | Admitting: Podiatry

## 2022-01-23 DIAGNOSIS — M2041 Other hammer toe(s) (acquired), right foot: Secondary | ICD-10-CM | POA: Diagnosis not present

## 2022-01-23 DIAGNOSIS — Z01818 Encounter for other preprocedural examination: Secondary | ICD-10-CM

## 2022-01-23 NOTE — Progress Notes (Signed)
?Subjective:  ?Patient ID: Carmen Buckley, female    DOB: [birth year],  MRN: [id number] ? ?Chief Complaint  ?Patient presents with  ? Callouses  ?  Painful corn on inside of right 5th toe x  41month  ? ? ?71y.o. female presents with the above complaint.  Patient presents with new complaint of right fifth digit hammertoe contracture.  She states that there is a corn forming in between the fifth and the fourth digit leading to a lot of pain.  She states fifth digit where the corn is because the bone is rub against it.  She states that she is not able to wear high heels and is causing a lot of pain with ambulation.  She wanted to get it evaluated she would like to discuss treatment options.  She would like to have it surgically taken out as she has failed all conservative treatment options been going for 4 months pain scale is 8 out of 10 hurts with ambulation. ? ? ?Review of Systems: Negative except as noted in the HPI. Denies N/V/F/Ch. ? ?Past Medical History:  ?Diagnosis Date  ? Anxiety   ? Depression   ? GERD (gastroesophageal reflux disease)   ? Hypertension   ? ? ?Current Outpatient Medications:  ?  atorvastatin (LIPITOR) 10 MG tablet, Take 10 mg by mouth daily., Disp: , Rfl:  ?  lisinopril-hydrochlorothiazide (ZESTORETIC) 20-25 MG tablet, Take 1 tablet by mouth daily. 40/25 MG HS, Disp: 90 tablet, Rfl: 3 ?  metoprolol tartrate (LOPRESSOR) 50 MG tablet, TAKE 1 TABLET BY MOUTH DAILY, Disp: 90 tablet, Rfl: 3 ?  omeprazole (PRILOSEC) 20 MG capsule, TAKE 1 TABLET BY MOUTH DAILY, Disp: 90 capsule, Rfl: 3 ? ?Current Facility-Administered Medications:  ?  loratadine (CLARITIN) tablet 10 mg, 10 mg, Oral, Once, MCletis Athens MD ? ?Social History  ? ?Tobacco Use  ?Smoking Status Some Days  ? Packs/day: 0.50  ? Types: Cigarettes  ?Smokeless Tobacco Never  ? ? ?No Known Allergies ?Objective:  ?There were no vitals filed for this visit. ?There is no height or weight on file to calculate BMI. ?Constitutional Well  developed. ?Well nourished.  ?Vascular Dorsalis pedis pulses palpable bilaterally. ?Posterior tibial pulses palpable bilaterally. ?Capillary refill normal to all digits.  ?No cyanosis or clubbing noted. ?Pedal hair growth normal.  ?Neurologic Normal speech. ?Oriented to person, place, and time. ?Epicritic sensation to light touch grossly present bilaterally.  ?Dermatologic Nails well groomed and normal in appearance. ?No open wounds. ?No skin lesions.  ?Orthopedic: Right fifth digit hammertoe contracture noted with hyperkeratotic lesion/heloma molle noted to the fifth digit.  Pain on palpation to the lesion.  No other bony abnormalities identified.  ? ?Radiographs: 3 views of skeletally mature the right foot: Hammertoe contracture right fifth digit noted. ?Assessment:  ? ?1. Hammertoe of right foot   ?2. Encounter for preoperative examination for general surgical procedure   ? ?Plan:  ?Patient was evaluated and treated and all questions answered. ? 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  I discussed this with the patient in extensive detail she states understanding to proceed with surgery.  I discussed my preoperative intraoperative postoperative plan in extensive detail. ?Informed surgical  risk consent was reviewed and read aloud to the patient.  I reviewed the films.  I have discussed my findings with the patient in great detail.  I have discussed all risks including but not limited to infection, stiffness, scarring, limp, disability, deformity, damage to blood vessels and nerves, numbness, poor healing, need for braces, arthritis, chronic pain,  amputation, death.  All benefits and realistic expectations discussed in great detail.  I have made no promises as to the outcome.  I have provided realistic expectations.  I have offered the patient a 2nd opinion, which they have declined and assured me they preferred to proceed despite the risks ? ?No follow-ups on file.  ?

## 2022-01-24 ENCOUNTER — Telehealth: Payer: Self-pay | Admitting: Urology

## 2022-01-24 NOTE — Telephone Encounter (Signed)
DOS - 02/17/22 ? ?HAMMERTOE REPAIR 5TH RIGHT --- [phone] ? ? ?[employer] EFFECTIVE DATE - 11/10/21 ? ?PLAN DEDUCTIBLE - $0.00 ?OUT OF POCKET - $8,300.00 W/ $7,577.60 REMAINING ?COINSURANCE - 20% ?COPAY - $0.00 ? ? ?PER [employer] [place] [address] Notification or Prior Authorization is not required for the requested services ? ?This [employer] Medicare Advantage members plan does not currently require a prior authorization for these services. If you have general questions about the prior authorization requirements, please call us at [phone] or visit [email or web address] > Clinician Resources > Advance and Admission Notification Requirements. The number above acknowledges your notification. Please write this number down for future reference. Notification is not a guarantee of coverage or payment. ? ?Decision ID #:[id number] ?

## 2022-02-03 DIAGNOSIS — H353212 Exudative age-related macular degeneration, right eye, with inactive choroidal neovascularization: Secondary | ICD-10-CM | POA: Diagnosis not present

## 2022-02-06 ENCOUNTER — Ambulatory Visit (INDEPENDENT_AMBULATORY_CARE_PROVIDER_SITE_OTHER): Payer: Medicare Other | Admitting: Nurse Practitioner

## 2022-02-06 ENCOUNTER — Encounter: Payer: Self-pay | Admitting: Nurse Practitioner

## 2022-02-06 VITALS — BP 136/90 | HR 88 | Ht 64.0 in | Wt 177.2 lb

## 2022-02-06 DIAGNOSIS — N390 Urinary tract infection, site not specified: Secondary | ICD-10-CM | POA: Diagnosis not present

## 2022-02-06 LAB — POCT URINALYSIS DIPSTICK
Bilirubin, UA: NEGATIVE
Blood, UA: NEGATIVE
Glucose, UA: NEGATIVE
Ketones, UA: NEGATIVE
Nitrite, UA: NEGATIVE
Protein, UA: POSITIVE — AB
Spec Grav, UA: 1.025 (ref 1.010–1.025)
Urobilinogen, UA: NEGATIVE E.U./dL — AB
pH, UA: 5 (ref 5.0–8.0)

## 2022-02-06 MED ORDER — CIPROFLOXACIN HCL 500 MG PO TABS: 500.0000 mg | ORAL_TABLET | Freq: Two times a day (BID) | ORAL | 0 refills | Status: AC

## 2022-02-06 NOTE — Progress Notes (Signed)
? ?Established Patient Office Visit ? ?Subjective:  ?Patient ID: Carmen Buckley, female    DOB: 06-Dec-1950  Age: 71 y.o. MRN: [id number] ? ?CC:  ?Chief Complaint  ?Patient presents with  ? Urinary Tract Infection  ? ? ? ?HPI ? ?Carmen Buckley presents for  ? ?Urinary Tract Infection  ?This is a new problem. The current episode started in the past 7 days. The problem has been unchanged. The quality of the pain is described as stabbing (in the right pelvic area). The pain is at a severity of 0/10 (pain only when lying down, no pain at present.). The pain is mild. There has been no fever. She is Sexually active. Associated symptoms include urgency. Pertinent negatives include no flank pain, frequency or nausea. She has tried nothing for the symptoms.   ? ?She also reports that she has been drinking cold coffee a lot.  ? ?Past Medical History:  ?Diagnosis Date  ? Anxiety   ? Depression   ? GERD (gastroesophageal reflux disease)   ? Hypertension   ? ? ?Past Surgical History:  ?Procedure Laterality Date  ? ABDOMINAL HYSTERECTOMY    ? CATARACT EXTRACTION W/PHACO Right 05/26/2017  ? Procedure: CATARACT EXTRACTION PHACO AND INTRAOCULAR LENS PLACEMENT ([facility]);  Surgeon: Birder Robson, MD;  Location: [facility] ORS;  Service: Ophthalmology;  Laterality: Right;  [place] 00:32 ?AP% 14.3 ?CDE 4.64 ?Fluid pack lot # [id number]  ? TENDON REPAIR Left   ? ARM  ? ? ?History reviewed. No pertinent family history. ? ?Social History  ? ?Socioeconomic History  ? Marital status: Single  ?  Spouse name: Not on file  ? Number of children: Not on file  ? Years of education: Not on file  ? Highest education level: Not on file  ?Occupational History  ? Not on file  ?Tobacco Use  ? Smoking status: Some Days  ?  Packs/day: 0.50  ?  Types: Cigarettes  ? Smokeless tobacco: Never  ?Substance and Sexual Activity  ? Alcohol use: No  ? Drug use: No  ? Sexual activity: Not on file  ?Other Topics Concern  ? Not on file  ?Social History Narrative  ? Not on file   ? ?Social Determinants of Health  ? ?Financial Resource Strain: Low Risk   ? Difficulty of Paying Living Expenses: Not very hard  ?Food Insecurity: Not on file  ?Transportation Needs: No Transportation Needs  ? Lack of Transportation (Medical): No  ? Lack of Transportation (Non-Medical): No  ?Physical Activity: Insufficiently Active  ? Days of Exercise per Week: 3 days  ? Minutes of Exercise per Session: 30 min  ?Stress: No Stress Concern Present  ? Feeling of Stress : Not at all  ?Social Connections: Moderately Integrated  ? Frequency of Communication with Friends and Family: More than three times a week  ? Frequency of Social Gatherings with Friends and Family: More than three times a week  ? Attends Religious Services: More than 4 times per year  ? Active Member of Clubs or Organizations: Yes  ? Attends Archivist Meetings: More than 4 times per year  ? Marital Status: Widowed  ?Intimate Partner Violence: Not At Risk  ? Fear of Current or Ex-Partner: No  ? Emotionally Abused: No  ? Physically Abused: No  ? Sexually Abused: No  ? ? ? ?Outpatient Medications Prior to Visit  ?Medication Sig Dispense Refill  ? atorvastatin (LIPITOR) 10 MG tablet Take 10 mg by mouth daily.    ?  lisinopril-hydrochlorothiazide (ZESTORETIC) 20-25 MG tablet Take 1 tablet by mouth daily. 40/25 MG HS 90 tablet 3  ? metoprolol tartrate (LOPRESSOR) 50 MG tablet TAKE 1 TABLET BY MOUTH DAILY 90 tablet 3  ? omeprazole (PRILOSEC) 20 MG capsule TAKE 1 TABLET BY MOUTH DAILY 90 capsule 3  ? ?Facility-Administered Medications Prior to Visit  ?Medication Dose Route Frequency Provider Last Rate Last Admin  ? loratadine (CLARITIN) tablet 10 mg  10 mg Oral Once Cletis Athens, MD      ? ? ?No Known Allergies ? ?ROS ?Review of Systems  ?Constitutional:  Negative for activity change, appetite change and fatigue.  ?HENT:  Negative for congestion, mouth sores, postnasal drip and sore throat.   ?Eyes: Negative.   ?Respiratory:  Negative for chest  tightness and shortness of breath.   ?Cardiovascular:  Negative for chest pain and palpitations.  ?Gastrointestinal:  Negative for abdominal distention, blood in stool and nausea.  ?Genitourinary:  Positive for pelvic pain and urgency. Negative for dysuria, flank pain and frequency.  ?Musculoskeletal: Negative.   ?Skin: Negative.   ?Neurological:  Negative for dizziness, light-headedness and headaches.  ?Psychiatric/Behavioral:  Negative for agitation, behavioral problems and confusion.   ? ?  ?Objective:  ?  ?Physical Exam ?Constitutional:   ?   Appearance: Normal appearance. She is obese.  ?HENT:  ?   Head: Normocephalic.  ?   Right Ear: Tympanic membrane normal.  ?   Left Ear: Tympanic membrane normal.  ?   Nose: Nose normal.  ?   Mouth/Throat:  ?   Mouth: Mucous membranes are moist.  ?   Pharynx: Oropharynx is clear.  ?Eyes:  ?   Extraocular Movements: Extraocular movements intact.  ?   Conjunctiva/sclera: Conjunctivae normal.  ?   Pupils: Pupils are equal, round, and reactive to light.  ?Cardiovascular:  ?   Rate and Rhythm: Normal rate and regular rhythm.  ?   Pulses: Normal pulses.  ?   Heart sounds: Normal heart sounds.  ?Pulmonary:  ?   Effort: Pulmonary effort is normal.  ?   Breath sounds: Normal breath sounds.  ?Abdominal:  ?   General: Bowel sounds are normal.  ?   Palpations: Abdomen is soft.  ?   Tenderness: There is no abdominal tenderness. There is no right CVA tenderness or left CVA tenderness.  ?Musculoskeletal:     ?   General: Normal range of motion.  ?   Cervical back: Normal range of motion.  ?Skin: ?   General: Skin is warm.  ?   Capillary Refill: Capillary refill takes less than 2 seconds.  ?Neurological:  ?   General: No focal deficit present.  ?   Mental Status: She is alert and oriented to person, place, and time. Mental status is at baseline.  ?Psychiatric:     ?   Mood and Affect: Mood normal.     ?   Behavior: Behavior normal.     ?   Thought Content: Thought content normal.     ?    Judgment: Judgment normal.  ? ? ?BP 136/90   Pulse 88   Ht 5' 4"  (1.626 m)   Wt 177 lb 3.2 oz (80.4 kg)   BMI 30.42 kg/m?  ?Wt Readings from Last 3 Encounters:  ?02/06/22 177 lb 3.2 oz (80.4 kg)  ?10/16/21 176 lb 14.4 oz (80.2 kg)  ?05/22/21 176 lb 4.8 oz (80 kg)  ? ? ? ?Health Maintenance Due  ?Topic Date Due  ?  Hepatitis C Screening  Never done  ? COLONOSCOPY (Pts 45-40yr Insurance coverage will need to be confirmed)  Never done  ? Pneumonia Vaccine 70 Years old (2 - PCV) 07/21/2015  ? DEXA SCAN  Never done  ? Zoster Vaccines- Shingrix (2 of 2) 08/22/2017  ? COVID-19 Vaccine (2 - Pfizer series) 01/16/2021  ? ? ?There are no preventive care reminders to display for this patient. ? ?Lab Results  ?Component Value Date  ? TSH 0.78 05/27/2021  ? ?Lab Results  ?Component Value Date  ? WBC 9.2 05/27/2021  ? HGB 14.0 05/27/2021  ? HCT 42.1 05/27/2021  ? MCV 96.3 05/27/2021  ? PLT 234 05/27/2021  ? ?Lab Results  ?Component Value Date  ? NA 144 05/27/2021  ? K 5.0 05/27/2021  ? CO2 20 05/27/2021  ? GLUCOSE 70 05/27/2021  ? BUN 13 05/27/2021  ? CREATININE 0.89 05/27/2021  ? BILITOT 0.4 05/27/2021  ? AST 24 05/27/2021  ? ALT 16 05/27/2021  ? PROT 6.8 05/27/2021  ? CALCIUM 9.7 05/27/2021  ? ANIONGAP 9 03/26/2015  ? EGFR 70 05/27/2021  ? ?Lab Results  ?Component Value Date  ? CHOL 224 (H) 05/27/2021  ? ?Lab Results  ?Component Value Date  ? HDL 58 05/27/2021  ? ?Lab Results  ?Component Value Date  ? LDLCALC 148 (H) 05/27/2021  ? ?Lab Results  ?Component Value Date  ? TRIG 81 05/27/2021  ? ?Lab Results  ?Component Value Date  ? CHOLHDL 3.9 05/27/2021  ? ?No results found for: HGBA1C ? ?  ?Assessment & Plan:  ? ?Problem List Items Addressed This Visit   ? ?  ? Genitourinary  ? Urinary tract infection without hematuria - Primary  ?  Urine positive for leucocytes and protein. ?Advised patient to increase the intake of water. ?Started her on ciprofloxacin 500 mg twice daily for 7 days.  ?  ?  ? Relevant Orders  ? POCT  urinalysis dipstick (Completed)  ? ? ? ?No orders of the defined types were placed in this encounter. ? ? ? ?Follow-up: No follow-ups on file.  ? ? ?CTheresia Lo NP ?

## 2022-02-17 ENCOUNTER — Other Ambulatory Visit: Payer: Self-pay | Admitting: Podiatry

## 2022-02-17 ENCOUNTER — Encounter: Payer: Self-pay | Admitting: Podiatry

## 2022-02-17 DIAGNOSIS — M24574 Contracture, right foot: Secondary | ICD-10-CM | POA: Diagnosis not present

## 2022-02-17 DIAGNOSIS — M2041 Other hammer toe(s) (acquired), right foot: Secondary | ICD-10-CM | POA: Diagnosis not present

## 2022-02-17 MED ORDER — IBUPROFEN 800 MG PO TABS: 800.0000 mg | ORAL_TABLET | Freq: Four times a day (QID) | ORAL | 1 refills | Status: DC | PRN

## 2022-02-17 MED ORDER — OXYCODONE-ACETAMINOPHEN 5-325 MG PO TABS: 1.0000 | ORAL_TABLET | ORAL | 0 refills | Status: DC | PRN

## 2022-02-25 ENCOUNTER — Ambulatory Visit (INDEPENDENT_AMBULATORY_CARE_PROVIDER_SITE_OTHER): Payer: Medicare Other

## 2022-02-25 ENCOUNTER — Ambulatory Visit (INDEPENDENT_AMBULATORY_CARE_PROVIDER_SITE_OTHER): Payer: Medicare Other | Admitting: Podiatry

## 2022-02-25 DIAGNOSIS — Z9889 Other specified postprocedural states: Secondary | ICD-10-CM

## 2022-02-25 DIAGNOSIS — M2041 Other hammer toe(s) (acquired), right foot: Secondary | ICD-10-CM | POA: Diagnosis not present

## 2022-02-25 NOTE — Progress Notes (Signed)
?  Subjective:  ?Patient ID: Carmen Buckley, female    DOB: 06-16-1951,  MRN: [id number] ? ?Chief Complaint  ?Patient presents with  ? Routine Post Op  ?  POV #1 DOS 02/17/2022 RT 5TH HAMMERTOE CORRECTION  ? ? ?DOS: 02/17/2022 ?Procedure: Right fifth digit hammertoe correction ? ?71 y.o. female returns for post-op check.  Patient states that she is doing well minimal pain.  Ambulating with surgical shoe bandages clean dry and intact. ? ?Review of Systems: Negative except as noted in the HPI. Denies N/V/F/Ch. ? ?Past Medical History:  ?Diagnosis Date  ? Anxiety   ? Depression   ? GERD (gastroesophageal reflux disease)   ? Hypertension   ? ? ?Current Outpatient Medications:  ?  atorvastatin (LIPITOR) 10 MG tablet, Take 10 mg by mouth daily., Disp: , Rfl:  ?  ibuprofen (ADVIL) 800 MG tablet, Take 1 tablet (800 mg total) by mouth every 6 (six) hours as needed., Disp: 60 tablet, Rfl: 1 ?  lisinopril-hydrochlorothiazide (ZESTORETIC) 20-25 MG tablet, Take 1 tablet by mouth daily. 40/25 MG HS, Disp: 90 tablet, Rfl: 3 ?  metoprolol tartrate (LOPRESSOR) 50 MG tablet, TAKE 1 TABLET BY MOUTH DAILY, Disp: 90 tablet, Rfl: 3 ?  omeprazole (PRILOSEC) 20 MG capsule, TAKE 1 TABLET BY MOUTH DAILY, Disp: 90 capsule, Rfl: 3 ?  oxyCODONE-acetaminophen (PERCOCET) 5-325 MG tablet, Take 1 tablet by mouth every 4 (four) hours as needed for severe pain., Disp: 30 tablet, Rfl: 0 ? ?Current Facility-Administered Medications:  ?  loratadine (CLARITIN) tablet 10 mg, 10 mg, Oral, Once, Cletis Athens, MD ? ?Social History  ? ?Tobacco Use  ?Smoking Status Some Days  ? Packs/day: 0.50  ? Types: Cigarettes  ?Smokeless Tobacco Never  ? ? ?No Known Allergies ?Objective:  ?There were no vitals filed for this visit. ?There is no height or weight on file to calculate BMI. ?Constitutional Well developed. ?Well nourished.  ?Vascular Foot warm and well perfused. ?Capillary refill normal to all digits.   ?Neurologic Normal speech. ?Oriented to person, place, and  time. ?Epicritic sensation to light touch grossly present bilaterally.  ?Dermatologic Skin healing well without signs of infection. Skin edges well coapted without signs of infection.  ?Orthopedic: Tenderness to palpation noted about the surgical site.  ? ?Radiographs: 3 views of skeletally mature the right foot: Good correction alignment noted arthroplasty of the fifth digit noted. ?Assessment:  ? ?1. Hammertoe of right foot   ?2. Status post foot surgery   ? ?Plan:  ?Patient was evaluated and treated and all questions answered. ? ?S/p foot surgery right ?-Progressing as expected post-operatively. ?-XR: See above ?-WB Status: Weightbearing as tolerated in surgical shoe ?-Sutures: Intact.  No clinical signs of Deis is noted.  No complication noted. ?-Medications: None ?-Foot redressed. ? ?No follow-ups on file.  ?

## 2022-03-05 DIAGNOSIS — H353231 Exudative age-related macular degeneration, bilateral, with active choroidal neovascularization: Secondary | ICD-10-CM | POA: Diagnosis not present

## 2022-03-11 ENCOUNTER — Encounter: Payer: Medicare Other | Admitting: Podiatry

## 2022-03-11 ENCOUNTER — Ambulatory Visit (INDEPENDENT_AMBULATORY_CARE_PROVIDER_SITE_OTHER): Payer: Medicare Other | Admitting: Podiatry

## 2022-03-11 DIAGNOSIS — M2041 Other hammer toe(s) (acquired), right foot: Secondary | ICD-10-CM

## 2022-03-11 DIAGNOSIS — Z9889 Other specified postprocedural states: Secondary | ICD-10-CM

## 2022-03-14 NOTE — Progress Notes (Signed)
?  Subjective:  ?Patient ID: Carmen Buckley, female    DOB: December 18, 1950,  MRN: [id number] ? ?No chief complaint on file. ? ? ?DOS: 02/17/2022 ?Procedure: Right fifth digit hammertoe correction ? ?71 y.o. female returns for post-op check.  Patient states that she is doing well minimal pain.  Ambulating with surgical shoe bandages clean dry and intact. ? ?Review of Systems: Negative except as noted in the HPI. Denies N/V/F/Ch. ? ?Past Medical History:  ?Diagnosis Date  ? Anxiety   ? Depression   ? GERD (gastroesophageal reflux disease)   ? Hypertension   ? ? ?Current Outpatient Medications:  ?  atorvastatin (LIPITOR) 10 MG tablet, Take 10 mg by mouth daily., Disp: , Rfl:  ?  ibuprofen (ADVIL) 800 MG tablet, Take 1 tablet (800 mg total) by mouth every 6 (six) hours as needed., Disp: 60 tablet, Rfl: 1 ?  lisinopril-hydrochlorothiazide (ZESTORETIC) 20-25 MG tablet, Take 1 tablet by mouth daily. 40/25 MG HS, Disp: 90 tablet, Rfl: 3 ?  metoprolol tartrate (LOPRESSOR) 50 MG tablet, TAKE 1 TABLET BY MOUTH DAILY, Disp: 90 tablet, Rfl: 3 ?  omeprazole (PRILOSEC) 20 MG capsule, TAKE 1 TABLET BY MOUTH DAILY, Disp: 90 capsule, Rfl: 3 ?  oxyCODONE-acetaminophen (PERCOCET) 5-325 MG tablet, Take 1 tablet by mouth every 4 (four) hours as needed for severe pain., Disp: 30 tablet, Rfl: 0 ? ?Current Facility-Administered Medications:  ?  loratadine (CLARITIN) tablet 10 mg, 10 mg, Oral, Once, Cletis Athens, MD ? ?Social History  ? ?Tobacco Use  ?Smoking Status Some Days  ? Packs/day: 0.50  ? Types: Cigarettes  ?Smokeless Tobacco Never  ? ? ?No Known Allergies ?Objective:  ?There were no vitals filed for this visit. ?There is no height or weight on file to calculate BMI. ?Constitutional Well developed. ?Well nourished.  ?Vascular Foot warm and well perfused. ?Capillary refill normal to all digits.   ?Neurologic Normal speech. ?Oriented to person, place, and time. ?Epicritic sensation to light touch grossly present bilaterally.  ?Dermatologic  Skin has completely reepithelialized.  No signs of Deis is noted.  Good correction alignment noted of the digit.  ?Orthopedic: No further tenderness to palpation noted about the surgical site.  ? ?Radiographs: 3 views of skeletally mature the right foot: Good correction alignment noted arthroplasty of the fifth digit noted. ?Assessment:  ? ?No diagnosis found. ? ?Plan:  ?Patient was evaluated and treated and all questions answered. ? ?S/p foot surgery right ?-Patient will has completely clinically healed.  Patient correction looks aligned.  No signs of pain noted at this time patient is officially discharged from my care.  I briefly discussed shoe gear modification as well.  She states understanding ? ?No follow-ups on file.  ?

## 2022-04-14 DIAGNOSIS — H353231 Exudative age-related macular degeneration, bilateral, with active choroidal neovascularization: Secondary | ICD-10-CM | POA: Diagnosis not present

## 2022-04-22 ENCOUNTER — Other Ambulatory Visit: Payer: Self-pay | Admitting: *Deleted

## 2022-04-22 DIAGNOSIS — Z1239 Encounter for other screening for malignant neoplasm of breast: Secondary | ICD-10-CM

## 2022-04-22 MED ORDER — LISINOPRIL-HYDROCHLOROTHIAZIDE 20-25 MG PO TABS: 1.0000 | ORAL_TABLET | Freq: Every day | ORAL | 3 refills | Status: DC

## 2022-04-22 MED ORDER — ATORVASTATIN CALCIUM 10 MG PO TABS: 10.0000 mg | ORAL_TABLET | Freq: Every day | ORAL | 3 refills | Status: DC

## 2022-04-30 DIAGNOSIS — H353212 Exudative age-related macular degeneration, right eye, with inactive choroidal neovascularization: Secondary | ICD-10-CM | POA: Diagnosis not present

## 2022-06-19 DIAGNOSIS — H353221 Exudative age-related macular degeneration, left eye, with active choroidal neovascularization: Secondary | ICD-10-CM | POA: Diagnosis not present

## 2022-07-16 DIAGNOSIS — H5213 Myopia, bilateral: Secondary | ICD-10-CM | POA: Diagnosis not present

## 2022-07-21 ENCOUNTER — Telehealth: Payer: Self-pay | Admitting: *Deleted

## 2022-07-21 NOTE — Patient Outreach (Signed)
  Care Coordination   Initial Visit Note   07/21/2022 Name: Carmen Buckley MRN: [id number] DOB: [birth year]  Carmen Buckley is a 71 y.o. year old female who sees Carmen Athens, MD for primary care. I spoke with  Carmen Buckley by phone today. Contact was made with the patient today to offer care coordination services as a benefit of their health plan. Patient declined services.  What matters to the patients health and wellness today?  Patient states she would like to schedule an appointment with PCP to discuss ongoing sinus issues. Nurse scheduled an appointment for patient.     Goals Addressed               This Visit's Progress     Patient Stated     COMPLETED: Patient Stated (pt-stated)        Patient states she has ongoing sinus issues and needs to schedule an appointment with PCP; Nurse was able to schedule an appointment for patient.        SDOH assessments and interventions completed:  No     Care Coordination Interventions Activated:  Yes  Care Coordination Interventions:  Yes, provided   Follow up plan: No further intervention required.   Encounter Outcome:  Pt. Refused   [name] RN, BSN [place] [phone] [name]

## 2022-07-30 ENCOUNTER — Ambulatory Visit (INDEPENDENT_AMBULATORY_CARE_PROVIDER_SITE_OTHER): Payer: Medicare Other | Admitting: Internal Medicine

## 2022-07-30 ENCOUNTER — Encounter: Payer: Self-pay | Admitting: Internal Medicine

## 2022-07-30 VITALS — BP 130/61 | HR 84 | Ht 64.0 in | Wt 176.5 lb

## 2022-07-30 DIAGNOSIS — Z1211 Encounter for screening for malignant neoplasm of colon: Secondary | ICD-10-CM

## 2022-07-30 DIAGNOSIS — Z720 Tobacco use: Secondary | ICD-10-CM

## 2022-07-30 DIAGNOSIS — J301 Allergic rhinitis due to pollen: Secondary | ICD-10-CM

## 2022-07-30 DIAGNOSIS — E669 Obesity, unspecified: Secondary | ICD-10-CM

## 2022-07-30 DIAGNOSIS — I10 Essential (primary) hypertension: Secondary | ICD-10-CM | POA: Diagnosis not present

## 2022-07-30 DIAGNOSIS — Z23 Encounter for immunization: Secondary | ICD-10-CM

## 2022-07-30 MED ORDER — METHYLPREDNISOLONE 4 MG PO TBPK: ORAL_TABLET | ORAL | 1 refills | Status: DC

## 2022-07-30 MED ORDER — LORATADINE 10 MG PO TABS: 10.0000 mg | ORAL_TABLET | Freq: Every day | ORAL | 3 refills | Status: DC

## 2022-07-30 NOTE — Assessment & Plan Note (Signed)
Take Claritin 10 mg p.o. daily 

## 2022-07-30 NOTE — Assessment & Plan Note (Signed)

## 2022-07-30 NOTE — Assessment & Plan Note (Signed)

## 2022-07-30 NOTE — Progress Notes (Signed)
Established Patient Office Visit  Subjective:  Patient ID: NIYLAH HASSAN, female    DOB: February 18, 1951  Age: 71 y.o. MRN: [id number]  CC:  Chief Complaint  Patient presents with   Sinus Problem    Patient has ongoing sinus problems. Patient states that she has been dealing with this for about 3 years. Patient has frequent headaches and feels like her nose stays stopped up.     Sinus Problem    [name] presents for sinus problem  Past Medical History:  Diagnosis Date   Anxiety    Depression    GERD (gastroesophageal reflux disease)    Hypertension     Past Surgical History:  Procedure Laterality Date   ABDOMINAL HYSTERECTOMY     CATARACT EXTRACTION W/PHACO Right 05/26/2017   Procedure: CATARACT EXTRACTION PHACO AND INTRAOCULAR LENS PLACEMENT ([place]);  Surgeon: Birder Robson, MD;  Location: [facility] ORS;  Service: Ophthalmology;  Laterality: Right;  [place] 00:32 AP% 14.3 CDE 4.64 Fluid pack lot # [id number]   TENDON REPAIR Left    ARM    History reviewed. No pertinent family history.  Social History   Socioeconomic History   Marital status: Single    Spouse name: Not on file   Number of children: Not on file   Years of education: Not on file   Highest education level: Not on file  Occupational History   Not on file  Tobacco Use   Smoking status: Some Days    Packs/day: 0.50    Types: Cigarettes   Smokeless tobacco: Never  Substance and Sexual Activity   Alcohol use: No   Drug use: No   Sexual activity: Not on file  Other Topics Concern   Not on file  Social History Narrative   Not on file   Social Determinants of Health   Financial Resource Strain: Low Risk  (08/09/2021)   Overall Financial Resource Strain (CARDIA)    Difficulty of Paying Living Expenses: Not very hard  Food Insecurity: Not on file  Transportation Needs: No Transportation Needs (08/09/2021)   PRAPARE - Hydrologist (Medical): No    Lack of Transportation  (Non-Medical): No  Physical Activity: Insufficiently Active (08/09/2021)   Exercise Vital Sign    Days of Exercise per Week: 3 days    Minutes of Exercise per Session: 30 min  Stress: No Stress Concern Present (08/09/2021)   [place]    Feeling of Stress : Not at all  Social Connections: Moderately Integrated (08/09/2021)   Social Connection and Isolation Panel [NHANES]    Frequency of Communication with Friends and Family: More than three times a week    Frequency of Social Gatherings with Friends and Family: More than three times a week    Attends Religious Services: More than 4 times per year    Active Member of [employer] or Organizations: Yes    Attends Archivist Meetings: More than 4 times per year    Marital Status: Widowed  Intimate Partner Violence: Not At Risk (08/09/2021)   Humiliation, Afraid, Rape, and Kick questionnaire    Fear of Current or Ex-Partner: No    Emotionally Abused: No    Physically Abused: No    Sexually Abused: No     Current Outpatient Medications:    loratadine (CLARITIN) 10 MG tablet, Take 1 tablet (10 mg total) by mouth daily., Disp: 90 tablet, Rfl: 3   atorvastatin (LIPITOR)  10 MG tablet, Take 1 tablet (10 mg total) by mouth daily., Disp: 90 tablet, Rfl: 3   ibuprofen (ADVIL) 800 MG tablet, Take 1 tablet (800 mg total) by mouth every 6 (six) hours as needed., Disp: 60 tablet, Rfl: 1   lisinopril-hydrochlorothiazide (ZESTORETIC) 20-25 MG tablet, Take 1 tablet by mouth daily. 40/25 MG HS, Disp: 90 tablet, Rfl: 3   metoprolol tartrate (LOPRESSOR) 50 MG tablet, TAKE 1 TABLET BY MOUTH DAILY, Disp: 90 tablet, Rfl: 3   omeprazole (PRILOSEC) 20 MG capsule, TAKE 1 TABLET BY MOUTH DAILY, Disp: 90 capsule, Rfl: 3   oxyCODONE-acetaminophen (PERCOCET) 5-325 MG tablet, Take 1 tablet by mouth every 4 (four) hours as needed for severe pain., Disp: 30 tablet, Rfl: 0  Current Facility-Administered  Medications:    loratadine (CLARITIN) tablet 10 mg, 10 mg, Oral, Once, Charie Pinkus, Viann Shove, MD   No Known Allergies  ROS Review of Systems  Constitutional: Negative.   HENT: Negative.    Eyes: Negative.   Respiratory: Negative.    Cardiovascular: Negative.   Gastrointestinal: Negative.   Endocrine: Negative.   Genitourinary: Negative.   Musculoskeletal: Negative.   Skin: Negative.   Allergic/Immunologic: Negative.   Neurological: Negative.   Hematological: Negative.   Psychiatric/Behavioral: Negative.    All other systems reviewed and are negative.     Objective:    Physical Exam Vitals reviewed.  Constitutional:      Appearance: Normal appearance.  HENT:     Mouth/Throat:     Mouth: Mucous membranes are moist.  Eyes:     Pupils: Pupils are equal, round, and reactive to light.  Neck:     Vascular: No carotid bruit.  Cardiovascular:     Rate and Rhythm: Normal rate and regular rhythm.     Pulses: Normal pulses.     Heart sounds: Normal heart sounds.  Pulmonary:     Effort: Pulmonary effort is normal.     Breath sounds: Normal breath sounds.  Abdominal:     General: Bowel sounds are normal.     Palpations: Abdomen is soft. There is no hepatomegaly, splenomegaly or mass.     Tenderness: There is no abdominal tenderness.     Hernia: No hernia is present.  Musculoskeletal:        General: No tenderness.     Cervical back: Neck supple.     Right lower leg: No edema.     Left lower leg: No edema.  Skin:    Findings: No rash.  Neurological:     Mental Status: She is alert and oriented to person, place, and time.     Motor: No weakness.  Psychiatric:        Mood and Affect: Mood and affect normal.        Behavior: Behavior normal.     BP 130/61   Pulse 84   Ht 5' 4"  (1.626 m)   Wt 176 lb 8 oz (80.1 kg)   BMI 30.30 kg/m  Wt Readings from Last 3 Encounters:  07/30/22 176 lb 8 oz (80.1 kg)  02/06/22 177 lb 3.2 oz (80.4 kg)  10/16/21 176 lb 14.4 oz (80.2 kg)      Health Maintenance Due  Topic Date Due   Hepatitis C Screening  Never done   COLONOSCOPY (Pts 45-31yr Insurance coverage will need to be confirmed)  Never done   Pneumonia Vaccine 70 Years old (2 - PCV) 07/21/2015   DEXA SCAN  Never done   Zoster Vaccines- Shingrix (2  of 2) 08/22/2017   COVID-19 Vaccine (2 - Pfizer series) 02/20/2021    There are no preventive care reminders to display for this patient.  Lab Results  Component Value Date   TSH 0.78 05/27/2021   Lab Results  Component Value Date   WBC 9.2 05/27/2021   HGB 14.0 05/27/2021   HCT 42.1 05/27/2021   MCV 96.3 05/27/2021   PLT 234 05/27/2021   Lab Results  Component Value Date   NA 144 05/27/2021   K 5.0 05/27/2021   CO2 20 05/27/2021   GLUCOSE 70 05/27/2021   BUN 13 05/27/2021   CREATININE 0.89 05/27/2021   BILITOT 0.4 05/27/2021   AST 24 05/27/2021   ALT 16 05/27/2021   PROT 6.8 05/27/2021   CALCIUM 9.7 05/27/2021   ANIONGAP 9 03/26/2015   EGFR 70 05/27/2021   Lab Results  Component Value Date   CHOL 224 (H) 05/27/2021   Lab Results  Component Value Date   HDL 58 05/27/2021   Lab Results  Component Value Date   LDLCALC 148 (H) 05/27/2021   Lab Results  Component Value Date   TRIG 81 05/27/2021   Lab Results  Component Value Date   CHOLHDL 3.9 05/27/2021   No results found for: "HGBA1C"    Assessment & Plan:   Problem List Items Addressed This Visit       Cardiovascular and Mediastinum   Essential hypertension     Patient denies any chest pain or shortness of breath there is no history of palpitation or paroxysmal nocturnal dyspnea   patient was advised to follow low-salt low-cholesterol diet    ideally I want to keep systolic blood pressure below 130 mmHg, patient was asked to check blood pressure one times a week and give me a report on that.  Patient will be follow-up in 3 months  or earlier as needed, patient will call me back for any change in the cardiovascular  symptoms Patient was advised to buy a book from local bookstore concerning blood pressure and read several chapters  every day.  This will be supplemented by some of the material we will give him from the office.  Patient should also utilize other resources like YouTube and Internet to learn more about the blood pressure and the diet.        Respiratory   Seasonal allergic rhinitis due to pollen    Take Claritin 10 mg p.o. daily        Other   Obesity (BMI 30-39.9)    - I encouraged the patient to lose weight.  - I educated them on making healthy dietary choices including eating more fruits and vegetables and less fried foods. - I encouraged the patient to exercise more, and educated on the benefits of exercise including weight loss, diabetes prevention, and hypertension prevention.   Dietary counseling with a registered dietician  Referral to a weight management support group (e.g. Weight Watchers, Overeaters Anonymous)  If your BMI is greater than 29 or you have gained more than 15 pounds you should work on weight loss.  Attend a healthy cooking class       Tobacco abuse    - I instructed the patient to stop smoking and provided them with smoking cessation materials.  - I informed the patient that smoking puts them at increased risk for cancer, COPD, hypertension, and more.  - Informed the patient to seek help if they begin to have trouble breathing, develop chest pain, start to  cough up blood, feel faint, or pass out.      Other Visit Diagnoses     Colon cancer screening    -  Primary   Relevant Orders   Ambulatory referral to Gastroenterology   Need for influenza vaccination       Relevant Orders   Flu Vaccine QUAD High Dose(Fluad) (Completed)       Meds ordered this encounter  Medications   loratadine (CLARITIN) 10 MG tablet    Sig: Take 1 tablet (10 mg total) by mouth daily.    Dispense:  90 tablet    Refill:  3    Follow-up: No follow-ups on file.    Cletis Athens, MD

## 2022-07-30 NOTE — Assessment & Plan Note (Signed)
-   I instructed the patient to stop smoking and provided them with smoking cessation materials.  - I informed the patient that smoking puts them at increased risk for cancer, COPD, hypertension, and more.  - Informed the patient to seek help if they begin to have trouble breathing, develop chest pain, start to cough up blood, feel faint, or pass out.  

## 2022-08-06 ENCOUNTER — Other Ambulatory Visit: Payer: Self-pay

## 2022-08-06 ENCOUNTER — Telehealth: Payer: Self-pay

## 2022-08-06 DIAGNOSIS — Z1211 Encounter for screening for malignant neoplasm of colon: Secondary | ICD-10-CM

## 2022-08-06 MED ORDER — NA SULFATE-K SULFATE-MG SULF 17.5-3.13-1.6 GM/177ML PO SOLN: 354.0000 mL | Freq: Once | ORAL | 0 refills | Status: AC

## 2022-08-06 NOTE — Telephone Encounter (Signed)
Gastroenterology Pre-Procedure Review  Request Date: 08/25/2022 Requesting Physician: Dr. Marius Ditch   PATIENT REVIEW QUESTIONS: The patient responded to the following health history questions as indicated:    1. Are you having any GI issues? no 2. Do you have a personal history of Polyps? Thinks the first colonoscopy she had one removed she thinks  3. Do you have a family history of Colon Cancer or Polyps? Mother Had colon polyps  4. Diabetes Mellitus? no 5. Joint replacements in the past 12 months?no 6. Major health problems in the past 3 months?no 7. Any artificial heart valves, MVP, or defibrillator?no    MEDICATIONS & ALLERGIES:    Patient reports the following regarding taking any anticoagulation/antiplatelet therapy:   Plavix, Coumadin, Eliquis, Xarelto, Lovenox, Pradaxa, Brilinta, or Effient? no Aspirin? '81mg'$    Patient confirms/reports the following medications:  Current Outpatient Medications  Medication Sig Dispense Refill   atorvastatin (LIPITOR) 10 MG tablet Take 1 tablet (10 mg total) by mouth daily. 90 tablet 3   ibuprofen (ADVIL) 800 MG tablet Take 1 tablet (800 mg total) by mouth every 6 (six) hours as needed. 60 tablet 1   lisinopril-hydrochlorothiazide (ZESTORETIC) 20-25 MG tablet Take 1 tablet by mouth daily. 40/25 MG HS 90 tablet 3   loratadine (CLARITIN) 10 MG tablet Take 1 tablet (10 mg total) by mouth daily. 90 tablet 3   methylPREDNISolone (MEDROL DOSEPAK) 4 MG TBPK tablet Medrol dose pack   4 mg   as directed 21 tablet 1   metoprolol tartrate (LOPRESSOR) 50 MG tablet TAKE 1 TABLET BY MOUTH DAILY 90 tablet 3   omeprazole (PRILOSEC) 20 MG capsule TAKE 1 TABLET BY MOUTH DAILY 90 capsule 3   oxyCODONE-acetaminophen (PERCOCET) 5-325 MG tablet Take 1 tablet by mouth every 4 (four) hours as needed for severe pain. 30 tablet 0   Current Facility-Administered Medications  Medication Dose Route Frequency Provider Last Rate Last Admin   loratadine (CLARITIN) tablet 10 mg   10 mg Oral Once Cletis Athens, MD        Patient confirms/reports the following allergies:  No Known Allergies  No orders of the defined types were placed in this encounter.   AUTHORIZATION INFORMATION Primary Insurance: 1D#: Group #:  Secondary Insurance: 1D#: Group #:  SCHEDULE INFORMATION: Date:  Time: Location:

## 2022-08-14 DIAGNOSIS — H353212 Exudative age-related macular degeneration, right eye, with inactive choroidal neovascularization: Secondary | ICD-10-CM | POA: Diagnosis not present

## 2022-08-18 DIAGNOSIS — H524 Presbyopia: Secondary | ICD-10-CM | POA: Diagnosis not present

## 2022-08-18 DIAGNOSIS — H353221 Exudative age-related macular degeneration, left eye, with active choroidal neovascularization: Secondary | ICD-10-CM | POA: Diagnosis not present

## 2022-08-22 ENCOUNTER — Encounter: Payer: Self-pay | Admitting: Gastroenterology

## 2022-08-25 ENCOUNTER — Ambulatory Visit
Admission: RE | Admit: 2022-08-25 | Discharge: 2022-08-25 | Disposition: A | Discharge status: Home / Self Care | Payer: Medicare Other | Attending: Gastroenterology | Admitting: Gastroenterology

## 2022-08-25 ENCOUNTER — Ambulatory Visit: Payer: Medicare Other | Admitting: Anesthesiology

## 2022-08-25 ENCOUNTER — Encounter
Admission: RE | Disposition: A | Discharge status: Home / Self Care | Payer: Self-pay | Source: Home / Self Care | Attending: Gastroenterology

## 2022-08-25 DIAGNOSIS — F17200 Nicotine dependence, unspecified, uncomplicated: Secondary | ICD-10-CM | POA: Diagnosis not present

## 2022-08-25 DIAGNOSIS — K644 Residual hemorrhoidal skin tags: Secondary | ICD-10-CM | POA: Insufficient documentation

## 2022-08-25 DIAGNOSIS — D126 Benign neoplasm of colon, unspecified: Secondary | ICD-10-CM | POA: Diagnosis not present

## 2022-08-25 DIAGNOSIS — D123 Benign neoplasm of transverse colon: Secondary | ICD-10-CM | POA: Insufficient documentation

## 2022-08-25 DIAGNOSIS — K635 Polyp of colon: Secondary | ICD-10-CM

## 2022-08-25 DIAGNOSIS — Z6833 Body mass index (BMI) 33.0-33.9, adult: Secondary | ICD-10-CM | POA: Insufficient documentation

## 2022-08-25 DIAGNOSIS — I10 Essential (primary) hypertension: Secondary | ICD-10-CM | POA: Insufficient documentation

## 2022-08-25 DIAGNOSIS — E669 Obesity, unspecified: Secondary | ICD-10-CM | POA: Insufficient documentation

## 2022-08-25 DIAGNOSIS — F419 Anxiety disorder, unspecified: Secondary | ICD-10-CM | POA: Insufficient documentation

## 2022-08-25 DIAGNOSIS — Z1211 Encounter for screening for malignant neoplasm of colon: Secondary | ICD-10-CM

## 2022-08-25 DIAGNOSIS — D175 Benign lipomatous neoplasm of intra-abdominal organs: Secondary | ICD-10-CM | POA: Diagnosis not present

## 2022-08-25 DIAGNOSIS — Z79899 Other long term (current) drug therapy: Secondary | ICD-10-CM | POA: Insufficient documentation

## 2022-08-25 DIAGNOSIS — K219 Gastro-esophageal reflux disease without esophagitis: Secondary | ICD-10-CM | POA: Insufficient documentation

## 2022-08-25 DIAGNOSIS — D125 Benign neoplasm of sigmoid colon: Secondary | ICD-10-CM | POA: Insufficient documentation

## 2022-08-25 DIAGNOSIS — K5730 Diverticulosis of large intestine without perforation or abscess without bleeding: Secondary | ICD-10-CM | POA: Insufficient documentation

## 2022-08-25 DIAGNOSIS — D124 Benign neoplasm of descending colon: Secondary | ICD-10-CM | POA: Diagnosis not present

## 2022-08-25 HISTORY — PX: COLONOSCOPY WITH PROPOFOL: SHX5780

## 2022-08-25 SURGERY — COLONOSCOPY WITH PROPOFOL
Anesthesia: General

## 2022-08-25 MED ORDER — EPHEDRINE SULFATE (PRESSORS) 50 MG/ML IJ SOLN
INTRAMUSCULAR | Status: DC | PRN
  Administered 2022-08-25: 5 mg via INTRAVENOUS
  Administered 2022-08-25: 10 mg via INTRAVENOUS

## 2022-08-25 MED ORDER — PROPOFOL 10 MG/ML IV BOLUS
INTRAVENOUS | Status: DC | PRN
  Administered 2022-08-25: 70 mg via INTRAVENOUS

## 2022-08-25 MED ORDER — SODIUM CHLORIDE 0.9 % IV SOLN
INTRAVENOUS | Status: DC
  Administered 2022-08-25: 20 mL/h via INTRAVENOUS

## 2022-08-25 MED ORDER — PROPOFOL 500 MG/50ML IV EMUL
INTRAVENOUS | Status: DC | PRN
  Administered 2022-08-25: 150 ug/kg/min via INTRAVENOUS

## 2022-08-25 NOTE — Anesthesia Preprocedure Evaluation (Signed)
Anesthesia Evaluation  Patient identified by MRN, date of birth, ID band Patient awake    Reviewed: Allergy & Precautions, NPO status , Patient's Chart, lab work & pertinent test results  Airway Mallampati: II  TM Distance: >3 FB Neck ROM: full    Dental  (+) Teeth Intact, Partial Lower, Partial Upper   Pulmonary neg pulmonary ROS, Current Smoker and Patient abstained from smoking.,    Pulmonary exam normal  + decreased breath sounds      Cardiovascular Exercise Tolerance: Good hypertension, Pt. on medications negative cardio ROS Normal cardiovascular exam Rhythm:Regular Rate:Normal     Neuro/Psych Anxiety negative neurological ROS  negative psych ROS   GI/Hepatic negative GI ROS, Neg liver ROS, GERD  Medicated,  Endo/Other  negative endocrine ROS  Renal/GU negative Renal ROS  negative genitourinary   Musculoskeletal negative musculoskeletal ROS (+)   Abdominal (+) + obese,   Peds negative pediatric ROS (+)  Hematology negative hematology ROS (+)   Anesthesia Other Findings Past Medical History: No date: Anxiety No date: Depression No date: GERD (gastroesophageal reflux disease) No date: Hypertension  Past Surgical History: No date: ABDOMINAL HYSTERECTOMY 05/26/2017: CATARACT EXTRACTION W/PHACO; Right     Comment:  Procedure: CATARACT EXTRACTION PHACO AND INTRAOCULAR               LENS PLACEMENT ([place]);  Surgeon: Birder Robson, MD;                Location: [facility] ORS;  Service: Ophthalmology;  Laterality:              Right;  [place] 00:32 AP% 14.3 CDE 4.64 Fluid pack lot #               [id number] No date: TENDON REPAIR; Left     Comment:  ARM  BMI    Body Mass Index: 33.20 kg/m      Reproductive/Obstetrics negative OB ROS                             Anesthesia Physical Anesthesia Plan  ASA: 3  Anesthesia Plan: General   Post-op Pain Management:    Induction:  Intravenous  PONV Risk Score and Plan: Propofol infusion and TIVA  Airway Management Planned: Natural Airway  Additional Equipment:   Intra-op Plan:   Post-operative Plan:   Informed Consent: I have reviewed the patients History and Physical, chart, labs and discussed the procedure including the risks, benefits and alternatives for the proposed anesthesia with the patient or authorized representative who has indicated his/her understanding and acceptance.     Dental Advisory Given  Plan Discussed with: CRNA and Surgeon  Anesthesia Plan Comments:         Anesthesia Quick Evaluation

## 2022-08-25 NOTE — Transfer of Care (Signed)
Immediate Anesthesia Transfer of Care Note  Patient: [name]  Procedure(s) Performed: COLONOSCOPY WITH PROPOFOL  Patient Location: PACU  Anesthesia Type:General  Level of Consciousness: awake, alert  and oriented  Airway & Oxygen Therapy: Patient Spontanous Breathing  Post-op Assessment: Report given to RN and Post -op Vital signs reviewed and stable  Post vital signs: Reviewed and stable  Last Vitals:  Vitals Value Taken Time  BP 100/64 08/25/22 1030  Temp    Pulse 67 08/25/22 1030  Resp 15 08/25/22 1030  SpO2 100 % 08/25/22 1030  Vitals shown include unvalidated device data.  Last Pain:  Vitals:   08/25/22 0904  TempSrc: Temporal  PainSc: 0-No pain         Complications: No notable events documented.

## 2022-08-25 NOTE — Op Note (Signed)
[facility] Patient Name: Carmen Buckley Procedure Date: 08/25/2022 9:53 AM MRN: [id number] Account #: [id number] Date of Birth: 1951/10/02 Admit Type: Outpatient Age: 71 Room: [facility] ENDO ROOM 4 Gender: Female Note Status: Finalized Instrument Name: Jasper Riling [id number] Procedure:             Colonoscopy Indications:           Screening for colorectal malignant neoplasm, Last                         colonoscopy 10 years ago Providers:             Lin Landsman MD, MD Referring MD:          Cletis Athens, MD (Referring MD) Medicines:             See the Anesthesia note for documentation of the                         administered medications, General Anesthesia Complications:         No immediate complications. Estimated blood loss: None. Procedure:             Pre-Anesthesia Assessment:                        - Prior to the procedure, a History and Physical was                         performed, and patient medications and allergies were                         reviewed. The patient is competent. The risks and                         benefits of the procedure and the sedation options and                         risks were discussed with the patient. All questions                         were answered and informed consent was obtained.                         Patient identification and proposed procedure were                         verified by the physician, the nurse, the                         anesthesiologist, the anesthetist and the technician                         in the pre-procedure area in the procedure room in the                         endoscopy suite. Mental Status Examination: alert and                         oriented. Airway Examination: normal oropharyngeal  airway and neck mobility. Respiratory Examination:                         clear to auscultation. CV Examination: normal.                          Prophylactic Antibiotics: The patient does not require                         prophylactic antibiotics. Prior Anticoagulants: The                         patient has taken no previous anticoagulant or                         antiplatelet agents. ASA Grade Assessment: III - A                         patient with severe systemic disease. After reviewing                         the risks and benefits, the patient was deemed in                         satisfactory condition to undergo the procedure. The                         anesthesia plan was to use general anesthesia.                         Immediately prior to administration of medications,                         the patient was re-assessed for adequacy to receive                         sedatives. The heart rate, respiratory rate, oxygen                         saturations, blood pressure, adequacy of pulmonary                         ventilation, and response to care were monitored                         throughout the procedure. The physical status of the                         patient was re-assessed after the procedure.                        After obtaining informed consent, the colonoscope was                         passed under direct vision. Throughout the procedure,                         the patient's blood pressure, pulse, and oxygen  saturations were monitored continuously. The                         Colonoscope was introduced through the anus and                         advanced to the the terminal ileum, with                         identification of the appendiceal orifice and IC                         valve. The colonoscopy was performed without                         difficulty. The patient tolerated the procedure well.                         The quality of the bowel preparation was evaluated                         using the BBPS [facility] Bowel Preparation Scale) with                          scores of: Right Colon = 3, Transverse Colon = 3 and                         Left Colon = 3 (entire mucosa seen well with no                         residual staining, small fragments of stool or opaque                         liquid). The total BBPS score equals 9. Findings:      The perianal and digital rectal examinations were normal. Pertinent       negatives include normal sphincter tone and no palpable rectal lesions.      The terminal ileum appeared normal.      Six sessile polyps were found in the sigmoid colon, 3, descending colon,       2 and transverse colon 1. The polyps were 3 to 8 mm in size. These       polyps were removed with a cold snare. Resection and retrieval were       complete.      Multiple small and large-mouthed diverticula were found in the       recto-sigmoid colon, sigmoid colon, ascending colon and cecum. There was       no evidence of diverticular bleeding.      There was a large lipoma, in the ascending colon.      Non-bleeding external hemorrhoids were found during retroflexion. The       hemorrhoids were medium-sized. Impression:            - The examined portion of the ileum was normal.                        - Six 3 to 8 mm polyps in the sigmoid colon, in the  descending colon and in the transverse colon, removed                         with a cold snare. Resected and retrieved.                        - Severe diverticulosis in the recto-sigmoid colon, in                         the sigmoid colon, in the ascending colon and in the                         cecum. There was no evidence of diverticular bleeding.                        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                        - Non-bleeding external hemorrhoids. Recommendation:        - Discharge patient to home (with escort).                        - Resume previous diet today.                        - Continue present medications.                        -  Await pathology results.                        - Repeat colonoscopy in 3 years for surveillance of                         multiple polyps. Procedure Code(s):     --- Professional ---                        [phone], Colonoscopy, flexible; with removal of                         tumor(s), polyp(s), or other lesion(s) by snare                         technique Diagnosis Code(s):     --- Professional ---                        Z12.11, Encounter for screening for malignant neoplasm                         of colon                        K64.4, Residual hemorrhoidal skin tags                        K63.5, Polyp of colon                        D17.5, Benign lipomatous neoplasm of intra-abdominal  organs                        K57.30, Diverticulosis of large intestine without                         perforation or abscess without bleeding CPT copyright 2019 American Medical Association. All rights reserved. The codes documented in this report are preliminary and upon coder review may  be revised to meet current compliance requirements. Dr. Ulyess Mort Lin Landsman MD, MD 08/25/2022 10:29:59 AM This report has been signed electronically. Number of Addenda: 0 Note Initiated On: 08/25/2022 9:53 AM Scope Withdrawal Time: 0 hours 19 minutes 9 seconds  Total Procedure Duration: 0 hours 24 minutes 43 seconds  Estimated Blood Loss:  Estimated blood loss: none.      [facility]

## 2022-08-25 NOTE — Anesthesia Postprocedure Evaluation (Signed)
Anesthesia Post Note  Patient: Carmen Buckley  Procedure(s) Performed: COLONOSCOPY WITH PROPOFOL  Patient location during evaluation: PACU Anesthesia Type: General Level of consciousness: awake and oriented Pain management: pain level controlled Vital Signs Assessment: post-procedure vital signs reviewed and stable Respiratory status: nonlabored ventilation Cardiovascular status: stable Anesthetic complications: no   No notable events documented.   Last Vitals:  Vitals:   08/25/22 0904 08/25/22 1030  BP: 90/61 100/64  Pulse: 64 73  Resp: 20 14  Temp: (!) 36.1 C (!) 36 C  SpO2: 100% 99%    Last Pain:  Vitals:   08/25/22 1030  TempSrc: Temporal  PainSc: 0-No pain                 VAN STAVEREN,Alis Sawchuk

## 2022-08-25 NOTE — H&P (Signed)
Cephas Darby, MD [address]  [place]  [place], [place] [address]  Main: [phone]  Fax: [phone] Pager: [phone]  Primary Care Physician:  Cletis Athens, MD Primary Gastroenterologist:  Dr. Cephas Darby  Pre-Procedure History & Physical: HPI:  Carmen Buckley is a 71 y.o. female is here for an colonoscopy.   Past Medical History:  Diagnosis Date   Anxiety    Depression    GERD (gastroesophageal reflux disease)    Hypertension     Past Surgical History:  Procedure Laterality Date   ABDOMINAL HYSTERECTOMY     CATARACT EXTRACTION W/PHACO Right 05/26/2017   Procedure: CATARACT EXTRACTION PHACO AND INTRAOCULAR LENS PLACEMENT ([place]);  Surgeon: Birder Robson, MD;  Location: [facility] ORS;  Service: Ophthalmology;  Laterality: Right;  [place] 00:32 AP% 14.3 CDE 4.64 Fluid pack lot # [id number]   TENDON REPAIR Left    ARM    Prior to Admission medications   Medication Sig Start Date End Date Taking? Authorizing Provider  atorvastatin (LIPITOR) 10 MG tablet Take 1 tablet (10 mg total) by mouth daily. 04/22/22  Yes Masoud, Viann Shove, MD  ibuprofen (ADVIL) 800 MG tablet Take 1 tablet (800 mg total) by mouth every 6 (six) hours as needed. 02/17/22  Yes Felipa Furnace, DPM  lisinopril-hydrochlorothiazide (ZESTORETIC) 20-25 MG tablet Take 1 tablet by mouth daily. 40/25 MG HS 04/22/22  Yes Masoud, Viann Shove, MD  loratadine (CLARITIN) 10 MG tablet Take 1 tablet (10 mg total) by mouth daily. 07/30/22  Yes Masoud, Viann Shove, MD  methylPREDNISolone (MEDROL DOSEPAK) 4 MG TBPK tablet Medrol dose pack   4 mg   as directed 07/30/22  Yes Masoud, Viann Shove, MD  metoprolol tartrate (LOPRESSOR) 50 MG tablet TAKE 1 TABLET BY MOUTH DAILY 03/07/21  Yes Masoud, Viann Shove, MD  omeprazole (PRILOSEC) 20 MG capsule TAKE 1 TABLET BY MOUTH DAILY 03/07/21  Yes Masoud, Viann Shove, MD  oxyCODONE-acetaminophen (PERCOCET) 5-325 MG tablet Take 1 tablet by mouth every 4 (four) hours as needed for severe pain. 02/17/22  Yes Felipa Furnace, DPM    Allergies as of 08/06/2022   (No Known Allergies)    History reviewed. No pertinent family history.  Social History   Socioeconomic History   Marital status: Single    Spouse name: Not on file   Number of children: Not on file   Years of education: Not on file   Highest education level: Not on file  Occupational History   Not on file  Tobacco Use   Smoking status: Some Days    Packs/day: 0.50    Types: Cigarettes   Smokeless tobacco: Never  Substance and Sexual Activity   Alcohol use: No   Drug use: No   Sexual activity: Not on file  Other Topics Concern   Not on file  Social History Narrative   Not on file   Social Determinants of Health   Financial Resource Strain: Low Risk  (08/09/2021)   Overall Financial Resource Strain (CARDIA)    Difficulty of Paying Living Expenses: Not very hard  Food Insecurity: Not on file  Transportation Needs: No Transportation Needs (08/09/2021)   PRAPARE - Hydrologist (Medical): No    Lack of Transportation (Non-Medical): No  Physical Activity: Insufficiently Active (08/09/2021)   Exercise Vital Sign    Days of Exercise per Week: 3 days    Minutes of Exercise per Session: 30 min  Stress: No Stress Concern Present (08/09/2021)   [employer] of  Occupational Health - Occupational Stress Questionnaire    Feeling of Stress : Not at all  Social Connections: Moderately Integrated (08/09/2021)   Social Connection and Isolation Panel [NHANES]    Frequency of Communication with Friends and Family: More than three times a week    Frequency of Social Gatherings with Friends and Family: More than three times a week    Attends Religious Services: More than 4 times per year    Active Member of [employer] or Organizations: Yes    Attends Archivist Meetings: More than 4 times per year    Marital Status: Widowed  Intimate Partner Violence: Not At Risk (08/09/2021)   Humiliation, Afraid, Rape, and  Kick questionnaire    Fear of Current or Ex-Partner: No    Emotionally Abused: No    Physically Abused: No    Sexually Abused: No    Review of Systems: See HPI, otherwise negative ROS  Physical Exam: BP 90/61   Pulse 64   Temp (!) 97 F (36.1 C) (Temporal)   Resp 20   Ht 5' (1.524 m)   Wt 77.1 kg   SpO2 100%   BMI 33.20 kg/m  General:   Alert,  pleasant and cooperative in NAD Head:  Normocephalic and atraumatic. Neck:  Supple; no masses or thyromegaly. Lungs:  Clear throughout to auscultation.    Heart:  Regular rate and rhythm. Abdomen:  Soft, nontender and nondistended. Normal bowel sounds, without guarding, and without rebound.   Neurologic:  Alert and  oriented x4;  grossly normal neurologically.  Impression/Plan: Carmen Buckley is here for an colonoscopy to be performed for colon cancer screening  Risks, benefits, limitations, and alternatives regarding  colonoscopy have been reviewed with the patient.  Questions have been answered.  All parties agreeable.   Sherri Sear, MD  08/25/2022, 9:47 AM

## 2022-08-26 ENCOUNTER — Encounter: Payer: Self-pay | Admitting: Gastroenterology

## 2022-08-26 LAB — SURGICAL PATHOLOGY

## 2022-08-28 ENCOUNTER — Ambulatory Visit: Payer: Medicare Other | Admitting: Podiatry

## 2022-10-09 DIAGNOSIS — H353212 Exudative age-related macular degeneration, right eye, with inactive choroidal neovascularization: Secondary | ICD-10-CM | POA: Diagnosis not present

## 2022-10-16 DIAGNOSIS — H353221 Exudative age-related macular degeneration, left eye, with active choroidal neovascularization: Secondary | ICD-10-CM | POA: Diagnosis not present

## 2022-12-04 DIAGNOSIS — H353212 Exudative age-related macular degeneration, right eye, with inactive choroidal neovascularization: Secondary | ICD-10-CM | POA: Diagnosis not present

## 2023-01-02 DIAGNOSIS — H353231 Exudative age-related macular degeneration, bilateral, with active choroidal neovascularization: Secondary | ICD-10-CM | POA: Diagnosis not present

## 2023-02-04 DIAGNOSIS — R002 Palpitations: Secondary | ICD-10-CM | POA: Diagnosis not present

## 2023-02-04 DIAGNOSIS — R252 Cramp and spasm: Secondary | ICD-10-CM | POA: Diagnosis not present

## 2023-02-27 DIAGNOSIS — H2512 Age-related nuclear cataract, left eye: Secondary | ICD-10-CM | POA: Diagnosis not present

## 2023-02-27 DIAGNOSIS — H353231 Exudative age-related macular degeneration, bilateral, with active choroidal neovascularization: Secondary | ICD-10-CM | POA: Diagnosis not present

## 2023-02-27 DIAGNOSIS — H43813 Vitreous degeneration, bilateral: Secondary | ICD-10-CM | POA: Diagnosis not present

## 2023-04-23 ENCOUNTER — Ambulatory Visit: Payer: 59 | Admitting: Podiatry

## 2023-05-01 DIAGNOSIS — H353231 Exudative age-related macular degeneration, bilateral, with active choroidal neovascularization: Secondary | ICD-10-CM | POA: Diagnosis not present

## 2023-05-12 ENCOUNTER — Ambulatory Visit (INDEPENDENT_AMBULATORY_CARE_PROVIDER_SITE_OTHER): Payer: 59 | Admitting: Podiatry

## 2023-05-12 DIAGNOSIS — M7662 Achilles tendinitis, left leg: Secondary | ICD-10-CM | POA: Diagnosis not present

## 2023-05-12 NOTE — Progress Notes (Signed)
Subjective:  Patient ID: Carmen Buckley, female    DOB: [birth year],  MRN: [id number]  Chief Complaint  Patient presents with   Foot Pain    Buckley foot pain worse at night     72 y.o. female presents with the above complaint.  Patient presents with complaint of left Achilles insertional pain.  Patient states is painful to touch is progressive gotten worse worse at nighttime hurts when getting out of bed taking the first of the morning she went to get it evaluated she is not diabetic she has not seen MRIs prior to seeing me for this.  She denies any other acute issues.  Pain scale 7 out of 10 dull achy in nature   Review of Systems: Negative except as noted in the HPI. Denies N/V/F/Ch.  Past Medical History:  Diagnosis Date   Anxiety    Depression    GERD (gastroesophageal reflux disease)    Hypertension     Current Outpatient Medications:    atorvastatin (LIPITOR) 10 MG tablet, Take 1 tablet (10 mg total) by mouth daily., Disp: 90 tablet, Rfl: 3   ibuprofen (ADVIL) 800 MG tablet, Take 1 tablet (800 mg total) by mouth every 6 (six) hours as needed., Disp: 60 tablet, Rfl: 1   lisinopril-hydrochlorothiazide (ZESTORETIC) 20-25 MG tablet, Take 1 tablet by mouth daily. 40/25 MG HS, Disp: 90 tablet, Rfl: 3   loratadine (CLARITIN) 10 MG tablet, Take 1 tablet (10 mg total) by mouth daily., Disp: 90 tablet, Rfl: 3   methylPREDNISolone (MEDROL DOSEPAK) 4 MG TBPK tablet, Medrol dose pack   4 mg   as directed, Disp: 21 tablet, Rfl: 1   metoprolol tartrate (LOPRESSOR) 50 MG tablet, TAKE 1 TABLET BY MOUTH DAILY, Disp: 90 tablet, Rfl: 3   omeprazole (PRILOSEC) 20 MG capsule, TAKE 1 TABLET BY MOUTH DAILY, Disp: 90 capsule, Rfl: 3   oxyCODONE-acetaminophen (PERCOCET) 5-325 MG tablet, Take 1 tablet by mouth every 4 (four) hours as needed for severe pain., Disp: 30 tablet, Rfl: 0  Current Facility-Administered Medications:    loratadine (CLARITIN) tablet 10 mg, 10 mg, Oral, Once, Masoud, Renda Rolls,  MD  Social History   Tobacco Use  Smoking Status Some Days   Current packs/day: 0.50   Types: Cigarettes  Smokeless Tobacco Never    No Known Allergies Objective:  There were no vitals filed for this visit. There is no height or weight on file to calculate BMI. Constitutional Well developed. Well nourished.  Vascular Dorsalis pedis pulses palpable bilaterally. Posterior tibial pulses palpable bilaterally. Capillary refill normal to all digits.  No cyanosis or clubbing noted. Pedal hair growth normal.  Neurologic Normal speech. Oriented to person, place, and time. Epicritic sensation to light touch grossly present bilaterally.  Dermatologic Nails well groomed and normal in appearance. No open wounds. No skin lesions.  Orthopedic: Pain on palpation left Achilles tendon insertional pain pain with dorsiflexion of the ankle joint positive Silfverskiold test noted gastrocnemius equinus.  Haglund's deformity clinically appreciated.   Radiographs: None Assessment:   1. Achilles tendinitis, left leg    Plan:  Patient was evaluated and treated and all questions answered.  Left Achilles tendinitis insertional pain -All questions or concerns were discussed with the patient in extensive detail -Given the amount of pain that she is having she will benefit from cam boot immobilization with decreased inflammatory component surgical pain.  Patient agrees with plan like to proceed with cam boot immobilization.  New cam boot was dispensed. -If there is  no improvement we will discuss MRI versus steroid injection.  No follow-ups on file.

## 2023-06-09 ENCOUNTER — Ambulatory Visit: Payer: 59 | Admitting: Podiatry

## 2023-06-09 DIAGNOSIS — M9262 Juvenile osteochondrosis of tarsus, left ankle: Secondary | ICD-10-CM

## 2023-06-09 DIAGNOSIS — M7662 Achilles tendinitis, left leg: Secondary | ICD-10-CM

## 2023-06-09 NOTE — Progress Notes (Signed)
Subjective:  Patient ID: Carmen Buckley, female    DOB: May 14, 1951,  MRN: [id number]  Chief Complaint  Patient presents with   Foot Pain    72 y.o. female presents with the above complaint.  Patient presents with complaint of left Achilles insertional pain.  Patient states is painful to touch is progressive gotten worse worse at nighttime hurts when getting out of bed taking the first of the morning she went to get it evaluated she is not diabetic she has not seen MRIs prior to seeing me for this.  She denies any other acute issues.  Pain scale 7 out of 10 dull achy in nature   Review of Systems: Negative except as noted in the HPI. Denies N/V/F/Ch.  Past Medical History:  Diagnosis Date   Anxiety    Depression    GERD (gastroesophageal reflux disease)    Hypertension     Current Outpatient Medications:    atorvastatin (LIPITOR) 10 MG tablet, Take 1 tablet (10 mg total) by mouth daily., Disp: 90 tablet, Rfl: 3   ibuprofen (ADVIL) 800 MG tablet, Take 1 tablet (800 mg total) by mouth every 6 (six) hours as needed., Disp: 60 tablet, Rfl: 1   lisinopril-hydrochlorothiazide (ZESTORETIC) 20-25 MG tablet, Take 1 tablet by mouth daily. 40/25 MG HS, Disp: 90 tablet, Rfl: 3   loratadine (CLARITIN) 10 MG tablet, Take 1 tablet (10 mg total) by mouth daily., Disp: 90 tablet, Rfl: 3   methylPREDNISolone (MEDROL DOSEPAK) 4 MG TBPK tablet, Medrol dose pack   4 mg   as directed, Disp: 21 tablet, Rfl: 1   metoprolol tartrate (LOPRESSOR) 50 MG tablet, TAKE 1 TABLET BY MOUTH DAILY, Disp: 90 tablet, Rfl: 3   omeprazole (PRILOSEC) 20 MG capsule, TAKE 1 TABLET BY MOUTH DAILY, Disp: 90 capsule, Rfl: 3   oxyCODONE-acetaminophen (PERCOCET) 5-325 MG tablet, Take 1 tablet by mouth every 4 (four) hours as needed for severe pain., Disp: 30 tablet, Rfl: 0  Current Facility-Administered Medications:    loratadine (CLARITIN) tablet 10 mg, 10 mg, Oral, Once, Masoud, Renda Rolls, MD  Social History   Tobacco Use   Smoking Status Some Days   Current packs/day: 0.50   Types: Cigarettes  Smokeless Tobacco Never    No Known Allergies Objective:  There were no vitals filed for this visit. There is no height or weight on file to calculate BMI. Constitutional Well developed. Well nourished.  Vascular Dorsalis pedis pulses palpable bilaterally. Posterior tibial pulses palpable bilaterally. Capillary refill normal to all digits.  No cyanosis or clubbing noted. Pedal hair growth normal.  Neurologic Normal speech. Oriented to person, place, and time. Epicritic sensation to light touch grossly present bilaterally.  Dermatologic Nails well groomed and normal in appearance. No open wounds. No skin lesions.  Orthopedic: Pain on palpation left Achilles tendon insertional pain pain with dorsiflexion of the ankle joint positive Silfverskiold test noted gastrocnemius equinus.  Haglund's deformity clinically appreciated.   Radiographs: None Assessment:   1. Achilles tendinitis, left leg   2. Haglund's deformity, left     Plan:  Patient was evaluated and treated and all questions answered.  Left Achilles tendinitis insertional pain with underlying Haglund's deformity -All questions or concerns were discussed with the patient in extensive detail -Patient will transition away from cam boot into a Tri-Lock ankle brace.  Given that she still has some residual pain I discussed her injection.  I also discussed the risk of rupture associate with this she states understand like to proceed  with steroid injection -A steroid injection was performed at left ankle Kager's fat pad using 1% plain Lidocaine and 10 mg of Kenalog. This was well tolerated.   No follow-ups on file.

## 2023-07-02 ENCOUNTER — Telehealth: Payer: Self-pay | Admitting: Podiatry

## 2023-07-02 NOTE — Telephone Encounter (Signed)
Patient called yesterday.  She got an injection in posterior heel a few weeks ago, and was doing better, but overnight, her pain came back with a vengeance and it's worse than ever.  She says she put the boot back on since the pain was worse again.    I called her back and left a voicemail to call the office and scheduled a f/u appointment with Dr. Allena Katz.

## 2023-07-21 ENCOUNTER — Encounter: Payer: Self-pay | Admitting: Podiatry

## 2023-07-21 ENCOUNTER — Ambulatory Visit (INDEPENDENT_AMBULATORY_CARE_PROVIDER_SITE_OTHER): Payer: 59 | Admitting: Podiatry

## 2023-07-21 DIAGNOSIS — M7662 Achilles tendinitis, left leg: Secondary | ICD-10-CM

## 2023-07-21 MED ORDER — MELOXICAM 15 MG PO TABS: 15.0000 mg | ORAL_TABLET | Freq: Every day | ORAL | 0 refills | Status: DC

## 2023-07-21 MED ORDER — METHYLPREDNISOLONE 4 MG PO TBPK: ORAL_TABLET | ORAL | 0 refills | Status: DC

## 2023-07-21 NOTE — Progress Notes (Signed)
Subjective:  Patient ID: Carmen Buckley, female    DOB: 1951/02/14,  MRN: [id number]  Chief Complaint  Patient presents with   Foot Pain    72 y.o. female presents with the above complaint.  Patient presents with complaint of left Achilles insertional pain.  Patient states is painful to touch is progressive gotten worse worse at nighttime hurts when getting out of bed taking the first of the morning she went to get it evaluated she is not diabetic she has not seen MRIs prior to seeing me for this.  She denies any other acute issues.  Pain scale 7 out of 10 dull achy in nature   Review of Systems: Negative except as noted in the HPI. Denies N/V/F/Ch.  Past Medical History:  Diagnosis Date   Anxiety    Depression    GERD (gastroesophageal reflux disease)    Hypertension     Current Outpatient Medications:    meloxicam (MOBIC) 15 MG tablet, Take 1 tablet (15 mg total) by mouth daily., Disp: 30 tablet, Rfl: 0   methylPREDNISolone (MEDROL DOSEPAK) 4 MG TBPK tablet, Take as directed, Disp: 21 each, Rfl: 0   atorvastatin (LIPITOR) 10 MG tablet, Take 1 tablet (10 mg total) by mouth daily., Disp: 90 tablet, Rfl: 3   ibuprofen (ADVIL) 800 MG tablet, Take 1 tablet (800 mg total) by mouth every 6 (six) hours as needed., Disp: 60 tablet, Rfl: 1   lisinopril-hydrochlorothiazide (ZESTORETIC) 20-25 MG tablet, Take 1 tablet by mouth daily. 40/25 MG HS, Disp: 90 tablet, Rfl: 3   loratadine (CLARITIN) 10 MG tablet, Take 1 tablet (10 mg total) by mouth daily., Disp: 90 tablet, Rfl: 3   methylPREDNISolone (MEDROL DOSEPAK) 4 MG TBPK tablet, Medrol dose pack   4 mg   as directed, Disp: 21 tablet, Rfl: 1   metoprolol tartrate (LOPRESSOR) 50 MG tablet, TAKE 1 TABLET BY MOUTH DAILY, Disp: 90 tablet, Rfl: 3   omeprazole (PRILOSEC) 20 MG capsule, TAKE 1 TABLET BY MOUTH DAILY, Disp: 90 capsule, Rfl: 3   oxyCODONE-acetaminophen (PERCOCET) 5-325 MG tablet, Take 1 tablet by mouth every 4 (four) hours as needed for  severe pain., Disp: 30 tablet, Rfl: 0  Current Facility-Administered Medications:    loratadine (CLARITIN) tablet 10 mg, 10 mg, Oral, Once, Masoud, Renda Rolls, MD  Social History   Tobacco Use  Smoking Status Some Days   Current packs/day: 0.50   Types: Cigarettes  Smokeless Tobacco Never    No Known Allergies Objective:  There were no vitals filed for this visit. There is no height or weight on file to calculate BMI. Constitutional Well developed. Well nourished.  Vascular Dorsalis pedis pulses palpable bilaterally. Posterior tibial pulses palpable bilaterally. Capillary refill normal to all digits.  No cyanosis or clubbing noted. Pedal hair growth normal.  Neurologic Normal speech. Oriented to person, place, and time. Epicritic sensation to light touch grossly present bilaterally.  Dermatologic Nails well groomed and normal in appearance. No open wounds. No skin lesions.  Orthopedic: Pain on palpation left Achilles tendon insertional pain pain with dorsiflexion of the ankle joint positive Silfverskiold test noted gastrocnemius equinus.  Haglund's deformity clinically appreciated.   Radiographs: None Assessment:   1. Achilles tendinitis, left leg      Plan:  Patient was evaluated and treated and all questions answered.  Left Achilles tendinitis insertional pain with underlying Haglund's deformity -All questions or concerns were discussed with the patient in extensive detail -Clinically her pain has not gotten any better.  Therefore I believe she will benefit from an MRI evaluation to rule out Achilles tendinitis/longitudinal tearing.  MRI was ordered. -Medrol Dosepak and Mobic was sent to the pharmacy  No follow-ups on file.

## 2023-07-22 DIAGNOSIS — H353231 Exudative age-related macular degeneration, bilateral, with active choroidal neovascularization: Secondary | ICD-10-CM | POA: Diagnosis not present

## 2023-08-08 ENCOUNTER — Inpatient Hospital Stay: Admission: RE | Admit: 2023-08-08 | Payer: 59 | Source: Ambulatory Visit

## 2023-08-10 ENCOUNTER — Ambulatory Visit
Admission: RE | Admit: 2023-08-10 | Discharge: 2023-08-10 | Disposition: A | Discharge status: Home / Self Care | Payer: 59 | Source: Ambulatory Visit | Attending: Podiatry | Admitting: Podiatry

## 2023-08-10 DIAGNOSIS — M25572 Pain in left ankle and joints of left foot: Secondary | ICD-10-CM | POA: Diagnosis not present

## 2023-08-10 DIAGNOSIS — M7662 Achilles tendinitis, left leg: Secondary | ICD-10-CM | POA: Diagnosis not present

## 2023-08-19 ENCOUNTER — Telehealth: Payer: Self-pay | Admitting: Podiatry

## 2023-08-19 NOTE — Telephone Encounter (Signed)
pt called about MRI results

## 2023-08-23 ENCOUNTER — Other Ambulatory Visit: Payer: Self-pay | Admitting: Podiatry

## 2023-09-15 ENCOUNTER — Ambulatory Visit (INDEPENDENT_AMBULATORY_CARE_PROVIDER_SITE_OTHER): Payer: 59 | Admitting: Podiatry

## 2023-09-15 ENCOUNTER — Encounter: Payer: Self-pay | Admitting: Podiatry

## 2023-09-15 VITALS — Ht 60.0 in | Wt 170.0 lb

## 2023-09-15 DIAGNOSIS — M7662 Achilles tendinitis, left leg: Secondary | ICD-10-CM | POA: Diagnosis not present

## 2023-09-15 DIAGNOSIS — M24572 Contracture, left ankle: Secondary | ICD-10-CM

## 2023-09-15 DIAGNOSIS — M9262 Juvenile osteochondrosis of tarsus, left ankle: Secondary | ICD-10-CM | POA: Diagnosis not present

## 2023-09-15 DIAGNOSIS — Z01818 Encounter for other preprocedural examination: Secondary | ICD-10-CM | POA: Diagnosis not present

## 2023-09-15 NOTE — Progress Notes (Signed)
Subjective:  Patient ID: Carmen Buckley, female    DOB: Jun 13, 1951,  MRN: [id number]  Chief Complaint  Patient presents with   Foot Pain    PT is here to go over MRI results and to talk about constant heel pain.    72 y.o. female presents with the above complaint.  Patient presents with complaint of left Achilles insertional pain.  She states the pain continues to be same nothing has helped she has failed all conservative care at this time she would like to discuss surgical options.  Pain scale is 7 out of 10 dull aching nature   Review of Systems: Negative except as noted in the HPI. Denies N/V/F/Ch.  Past Medical History:  Diagnosis Date   Anxiety    Depression    GERD (gastroesophageal reflux disease)    Hypertension     Current Outpatient Medications:    atorvastatin (LIPITOR) 10 MG tablet, Take 1 tablet (10 mg total) by mouth daily., Disp: 90 tablet, Rfl: 3   ibuprofen (ADVIL) 800 MG tablet, Take 1 tablet (800 mg total) by mouth every 6 (six) hours as needed., Disp: 60 tablet, Rfl: 1   lisinopril-hydrochlorothiazide (ZESTORETIC) 20-25 MG tablet, Take 1 tablet by mouth daily. 40/25 MG HS, Disp: 90 tablet, Rfl: 3   loratadine (CLARITIN) 10 MG tablet, Take 1 tablet (10 mg total) by mouth daily., Disp: 90 tablet, Rfl: 3   meloxicam (MOBIC) 15 MG tablet, Take 1 tablet (15 mg total) by mouth daily., Disp: 30 tablet, Rfl: 0   methylPREDNISolone (MEDROL DOSEPAK) 4 MG TBPK tablet, Medrol dose pack   4 mg   as directed, Disp: 21 tablet, Rfl: 1   methylPREDNISolone (MEDROL DOSEPAK) 4 MG TBPK tablet, Take as directed, Disp: 21 each, Rfl: 0   metoprolol tartrate (LOPRESSOR) 50 MG tablet, TAKE 1 TABLET BY MOUTH DAILY, Disp: 90 tablet, Rfl: 3   omeprazole (PRILOSEC) 20 MG capsule, TAKE 1 TABLET BY MOUTH DAILY, Disp: 90 capsule, Rfl: 3   oxyCODONE-acetaminophen (PERCOCET) 5-325 MG tablet, Take 1 tablet by mouth every 4 (four) hours as needed for severe pain., Disp: 30 tablet, Rfl: 0  Current  Facility-Administered Medications:    loratadine (CLARITIN) tablet 10 mg, 10 mg, Oral, Once, Masoud, Renda Rolls, MD  Social History   Tobacco Use  Smoking Status Some Days   Current packs/day: 0.50   Types: Cigarettes  Smokeless Tobacco Never    No Known Allergies Objective:  There were no vitals filed for this visit. Body mass index is 33.2 kg/m. Constitutional Well developed. Well nourished.  Vascular Dorsalis pedis pulses palpable bilaterally. Posterior tibial pulses palpable bilaterally. Capillary refill normal to all digits.  No cyanosis or clubbing noted. Pedal hair growth normal.  Neurologic Normal speech. Oriented to person, place, and time. Epicritic sensation to light touch grossly present bilaterally.  Dermatologic Nails well groomed and normal in appearance. No open wounds. No skin lesions.  Orthopedic: Pain on palpation left Achilles tendon insertional pain pain with dorsiflexion of the ankle joint positive Silfverskiold test noted gastrocnemius equinus.  Haglund's deformity clinically appreciated.  IMPRESSION: 1. Moderate tendinosis of the Achilles tendon spanning the distal 5 cm and without a tear. Assessment:   1. Achilles tendinitis, left leg   2. Haglund's deformity, left   3. Equinus contracture of left ankle   4. Encounter for preoperative examination for general surgical procedure       Plan:  Patient was evaluated and treated and all questions answered.  Left Achilles tendinitis  insertional pain with underlying Haglund's deformity with underlying gastrocnemius equinus -All questions or concerns were discussed with the patient in extensive detail -MRI was reviewed with the patient in extensive detail which shows tendinosis of the tendon likely leading to failure of conservative care including cam boot immobilization injection padding offloading shoe gear modification.  At this time I discussed my preoperative intra or postoperative plan with the  patient extensive detail patient will benefit from surgical correction of the Achilles tendon with repair of the tendon resection of the Haglund's deformity and gastrocnemius recession to address the ankle equinus -Informed surgical risk consent was reviewed and read aloud to the patient.  I reviewed the films.  I have discussed my findings with the patient in great detail.  I have discussed all risks including but not limited to infection, stiffness, scarring, limp, disability, deformity, damage to blood vessels and nerves, numbness, poor healing, need for braces, arthritis, chronic pain, amputation, death.  All benefits and realistic expectations discussed in great detail.  I have made no promises as to the outcome.  I have provided realistic expectations.  I have offered the patient a 2nd opinion, which they have declined and assured me they preferred to proceed despite the risks   No follow-ups on file.

## 2023-09-30 ENCOUNTER — Telehealth: Payer: Self-pay | Admitting: Podiatry

## 2023-09-30 NOTE — Telephone Encounter (Signed)
DOS- 10/12/2023  CALCANEAL OSTECTOMY [id number] GASTROCNEMIUS RECESS [id number] REPAIR ACHILLES TENDON [id number]  [facility] EFFECTIVE DATE-11/10/2022  DEDUCTIBLE - $240.00 WITH REMAINING  $0.00 OOP-$8850.00 WITH REMAINING $6037.56 COINSURANCE- 20%  PER THE [employer] WEBSITE PORTAL, PRIOR AUTH IS NOT REQUIRED FOR CPT CODES 57846, [id number] AND [phone].  AUTH Decision ID #: [id number].

## 2023-10-07 DIAGNOSIS — H353231 Exudative age-related macular degeneration, bilateral, with active choroidal neovascularization: Secondary | ICD-10-CM | POA: Diagnosis not present

## 2023-10-12 ENCOUNTER — Other Ambulatory Visit: Payer: Self-pay | Admitting: Podiatry

## 2023-10-12 DIAGNOSIS — M7732 Calcaneal spur, left foot: Secondary | ICD-10-CM | POA: Diagnosis not present

## 2023-10-12 DIAGNOSIS — M7662 Achilles tendinitis, left leg: Secondary | ICD-10-CM | POA: Diagnosis not present

## 2023-10-12 DIAGNOSIS — G8918 Other acute postprocedural pain: Secondary | ICD-10-CM | POA: Diagnosis not present

## 2023-10-12 DIAGNOSIS — M9262 Juvenile osteochondrosis of tarsus, left ankle: Secondary | ICD-10-CM | POA: Diagnosis not present

## 2023-10-12 DIAGNOSIS — M93272 Osteochondritis dissecans, left ankle and joints of left foot: Secondary | ICD-10-CM | POA: Diagnosis not present

## 2023-10-12 DIAGNOSIS — S86012A Strain of left Achilles tendon, initial encounter: Secondary | ICD-10-CM | POA: Diagnosis not present

## 2023-10-12 DIAGNOSIS — M216X2 Other acquired deformities of left foot: Secondary | ICD-10-CM | POA: Diagnosis not present

## 2023-10-12 MED ORDER — IBUPROFEN 800 MG PO TABS: 800.0000 mg | ORAL_TABLET | Freq: Four times a day (QID) | ORAL | 1 refills | Status: DC | PRN

## 2023-10-12 MED ORDER — RIVAROXABAN 10 MG PO TABS: 10.0000 mg | ORAL_TABLET | Freq: Every day | ORAL | 0 refills | Status: DC

## 2023-10-12 MED ORDER — OXYCODONE-ACETAMINOPHEN 5-325 MG PO TABS: 1.0000 | ORAL_TABLET | ORAL | 0 refills | Status: DC | PRN

## 2023-10-20 ENCOUNTER — Encounter: Payer: Self-pay | Admitting: Podiatry

## 2023-10-20 ENCOUNTER — Ambulatory Visit (INDEPENDENT_AMBULATORY_CARE_PROVIDER_SITE_OTHER): Payer: 59 | Admitting: Podiatry

## 2023-10-20 VITALS — Ht 60.0 in | Wt 170.0 lb

## 2023-10-20 DIAGNOSIS — M7662 Achilles tendinitis, left leg: Secondary | ICD-10-CM

## 2023-10-20 DIAGNOSIS — M24572 Contracture, left ankle: Secondary | ICD-10-CM

## 2023-10-20 DIAGNOSIS — M9262 Juvenile osteochondrosis of tarsus, left ankle: Secondary | ICD-10-CM

## 2023-10-20 DIAGNOSIS — Z9889 Other specified postprocedural states: Secondary | ICD-10-CM

## 2023-10-20 NOTE — Progress Notes (Signed)
Subjective:  Patient ID: Carmen Buckley, female    DOB: [birth year],  MRN: [id number]  Chief Complaint  Patient presents with   Routine Post Op    Pt is here due to routine post op visit 1, pt states there is still some pain but working thru it.    DOS: 10/12/2023 Procedure: Left gastrocnemius recession with Haglund's resection and ankle tendon repair  72 y.o. female returns for post-op check.  Patient states that she is doing well denies any other acute complaints nonweightbearing to the left lower extremity with crutches no nausea fever chills vomiting  Review of Systems: Negative except as noted in the HPI. Denies N/V/F/Ch.  Past Medical History:  Diagnosis Date   Anxiety    Depression    GERD (gastroesophageal reflux disease)    Hypertension     Current Outpatient Medications:    atorvastatin (LIPITOR) 10 MG tablet, Take 1 tablet (10 mg total) by mouth daily., Disp: 90 tablet, Rfl: 3   ibuprofen (ADVIL) 800 MG tablet, Take 1 tablet (800 mg total) by mouth every 6 (six) hours as needed., Disp: 60 tablet, Rfl: 1   ibuprofen (ADVIL) 800 MG tablet, Take 1 tablet (800 mg total) by mouth every 6 (six) hours as needed., Disp: 60 tablet, Rfl: 1   lisinopril-hydrochlorothiazide (ZESTORETIC) 20-25 MG tablet, Take 1 tablet by mouth daily. 40/25 MG HS, Disp: 90 tablet, Rfl: 3   loratadine (CLARITIN) 10 MG tablet, Take 1 tablet (10 mg total) by mouth daily., Disp: 90 tablet, Rfl: 3   meloxicam (MOBIC) 15 MG tablet, Take 1 tablet (15 mg total) by mouth daily., Disp: 30 tablet, Rfl: 0   methylPREDNISolone (MEDROL DOSEPAK) 4 MG TBPK tablet, Medrol dose pack   4 mg   as directed, Disp: 21 tablet, Rfl: 1   methylPREDNISolone (MEDROL DOSEPAK) 4 MG TBPK tablet, Take as directed, Disp: 21 each, Rfl: 0   metoprolol tartrate (LOPRESSOR) 50 MG tablet, TAKE 1 TABLET BY MOUTH DAILY, Disp: 90 tablet, Rfl: 3   omeprazole (PRILOSEC) 20 MG capsule, TAKE 1 TABLET BY MOUTH DAILY, Disp: 90 capsule, Rfl: 3    oxyCODONE-acetaminophen (PERCOCET) 5-325 MG tablet, Take 1 tablet by mouth every 4 (four) hours as needed for severe pain., Disp: 30 tablet, Rfl: 0   oxyCODONE-acetaminophen (PERCOCET) 5-325 MG tablet, Take 1 tablet by mouth every 4 (four) hours as needed for severe pain (pain score 7-10)., Disp: 30 tablet, Rfl: 0   rivaroxaban (XARELTO) 10 MG TABS tablet, Take 1 tablet (10 mg total) by mouth daily., Disp: 30 tablet, Rfl: 0  Current Facility-Administered Medications:    loratadine (CLARITIN) tablet 10 mg, 10 mg, Oral, Once, Masoud, Renda Rolls, MD  Social History   Tobacco Use  Smoking Status Some Days   Current packs/day: 0.50   Types: Cigarettes  Smokeless Tobacco Never    No Known Allergies Objective:  There were no vitals filed for this visit. Body mass index is 33.2 kg/m. Constitutional Well developed. Well nourished.  Vascular Foot warm and well perfused. Capillary refill normal to all digits.   Neurologic Normal speech. Oriented to person, place, and time. Epicritic sensation to light touch grossly present bilaterally.  Dermatologic Skin healing well without signs of infection. Skin edges well coapted without signs of infection.  Orthopedic: Tenderness to palpation noted about the surgical site.   Radiographs: None Assessment:   1. Achilles tendinitis, left leg   2. Haglund's deformity, left   3. Equinus contracture of left ankle   4.  S/P foot surgery    Plan:  Patient was evaluated and treated and all questions answered.  S/p foot surgery left -Progressing as expected post-operatively. -XR: See above -WB Status: Nonweightbearing in left lower extremity in crutches -Sutures: Intact.  No clinical signs of dehiscence noted no complication noted. -Medications: None -Foot redressed.  Plan for suture removal in 2 weeks  No follow-ups on file.

## 2023-11-06 ENCOUNTER — Ambulatory Visit (INDEPENDENT_AMBULATORY_CARE_PROVIDER_SITE_OTHER): Payer: 59 | Admitting: Podiatry

## 2023-11-06 VITALS — Ht 60.0 in | Wt 170.0 lb

## 2023-11-06 DIAGNOSIS — M7662 Achilles tendinitis, left leg: Secondary | ICD-10-CM

## 2023-11-06 NOTE — Progress Notes (Signed)
   Chief Complaint  Patient presents with   Routine Post Op    Pt is here for second post op visit, no complaints at all.    Subjective:  Patient presents today status post posterior heel spur resection with repair of Achilles tendon and gastroc aponeurosis lengthening left.  DOS: 10/12/2023.  Patient doing well.  NWB in the cam boot with crutches.  Presenting for staple removal  Past Medical History:  Diagnosis Date   Anxiety    Depression    GERD (gastroesophageal reflux disease)    Hypertension     Past Surgical History:  Procedure Laterality Date   ABDOMINAL HYSTERECTOMY     CATARACT EXTRACTION W/PHACO Right 05/26/2017   Procedure: CATARACT EXTRACTION PHACO AND INTRAOCULAR LENS PLACEMENT (IOC);  Surgeon: Galen Manila, MD;  Location: [facility] ORS;  Service: Ophthalmology;  Laterality: Right;  [place] 00:32 AP% 14.3 CDE 4.64 Fluid pack lot # [id number]   COLONOSCOPY WITH PROPOFOL N/A 08/25/2022   Procedure: COLONOSCOPY WITH PROPOFOL;  Surgeon: Toney Reil, MD;  Location: [facility] ENDOSCOPY;  Service: Gastroenterology;  Laterality: N/A;   TENDON REPAIR Left    ARM    No Known Allergies  Objective/Physical Exam Neurovascular status intact.  Incision well coapted with staples intact. No sign of infectious process noted. No dehiscence. No active bleeding noted.  No edema.  Overall well-healing surgical foot   Assessment: 1. s/p posterior heel spur resection with repair of Achilles tendon the left.  Gastric aponeurosis lengthening left. DOS: 10/12/2023.  Dr. Nicholes Rough   Plan of Care:  -Patient was evaluated.  -Staples removed -Compression ankle sleeves dispensed.  Wear daily -Continue NWB in the cam boot with crutches -Return to clinic next scheduled appointment with Dr. Boyd Kerbs, DPM [facility]  Dr. Felecia Shelling, DPM    [address] [place], [place] [address]                Office [phone]  Fax  [phone]

## 2023-11-19 ENCOUNTER — Ambulatory Visit (INDEPENDENT_AMBULATORY_CARE_PROVIDER_SITE_OTHER): Payer: 59 | Admitting: Podiatry

## 2023-11-19 ENCOUNTER — Ambulatory Visit (INDEPENDENT_AMBULATORY_CARE_PROVIDER_SITE_OTHER): Payer: 59

## 2023-11-19 ENCOUNTER — Encounter: Payer: Self-pay | Admitting: Podiatry

## 2023-11-19 DIAGNOSIS — M216X2 Other acquired deformities of left foot: Secondary | ICD-10-CM

## 2023-11-19 DIAGNOSIS — M7662 Achilles tendinitis, left leg: Secondary | ICD-10-CM

## 2023-11-19 DIAGNOSIS — M9262 Juvenile osteochondrosis of tarsus, left ankle: Secondary | ICD-10-CM

## 2023-11-19 DIAGNOSIS — Z9889 Other specified postprocedural states: Secondary | ICD-10-CM

## 2023-11-19 NOTE — Progress Notes (Signed)
   Chief Complaint  Patient presents with   Routine Post Op    DOS: 10/12/2023   Left gastrocnemius recession with Haglund's resection and ankle tendon repair It's fine.  Does he want me to do physical therapy?    Subjective:  Patient presents today status post posterior heel spur resection with repair of Achilles tendon and gastroc aponeurosis lengthening left.  DOS: 10/12/2023.  Patient doing well.  NWB in the cam boot with crutches.  Presenting for staple removal  Past Medical History:  Diagnosis Date   Anxiety    Depression    GERD (gastroesophageal reflux disease)    Hypertension     Past Surgical History:  Procedure Laterality Date   ABDOMINAL HYSTERECTOMY     CATARACT EXTRACTION W/PHACO Right 05/26/2017   Procedure: CATARACT EXTRACTION PHACO AND INTRAOCULAR LENS PLACEMENT (IOC);  Surgeon: Jaye Fallow, MD;  Location: [facility] ORS;  Service: Ophthalmology;  Laterality: Right;  US  00:32 AP% 14.3 CDE 4.64 Fluid pack lot # [id number]   COLONOSCOPY WITH PROPOFOL  N/A 08/25/2022   Procedure: COLONOSCOPY WITH PROPOFOL ;  Surgeon: Unk Corinn Skiff, MD;  Location: [facility] ENDOSCOPY;  Service: Gastroenterology;  Laterality: N/A;   TENDON REPAIR Left    ARM    No Known Allergies  Objective/Physical Exam Neurovascular status intact.  Incision well coapted with staples intact. No sign of infectious process noted. No dehiscence. No active bleeding noted.  No edema.  Overall well-healing surgical foot   Assessment: 1. s/p posterior heel spur resection with repair of Achilles tendon the left.  Gastric aponeurosis lengthening left. DOS: 10/12/2023.    Plan of Care:  - Clinically doing much better.  Incisions have completely reepithelialized.  Patient will benefit from physical therapy to help with gait gait training and strengthening.  Prescription for physical therapy was given.  At this time I will see her back for final visit in 6 weeks.  She states understanding

## 2023-11-24 ENCOUNTER — Other Ambulatory Visit: Payer: Self-pay

## 2023-11-24 ENCOUNTER — Telehealth: Payer: Self-pay | Admitting: Podiatry

## 2023-11-24 DIAGNOSIS — M24572 Contracture, left ankle: Secondary | ICD-10-CM

## 2023-11-24 DIAGNOSIS — Z9889 Other specified postprocedural states: Secondary | ICD-10-CM

## 2023-11-24 DIAGNOSIS — M9262 Juvenile osteochondrosis of tarsus, left ankle: Secondary | ICD-10-CM

## 2023-11-24 DIAGNOSIS — M7662 Achilles tendinitis, left leg: Secondary | ICD-10-CM

## 2023-11-24 NOTE — Telephone Encounter (Signed)
 This patient would like for you to send her ppw for PT to [facility], therapist name is Yetta Numbers.  The phone number is [phone] and the fax number is [phone].  She does not want to go where you originally referred her to.

## 2023-12-03 DIAGNOSIS — Z4789 Encounter for other orthopedic aftercare: Secondary | ICD-10-CM | POA: Diagnosis not present

## 2023-12-03 DIAGNOSIS — R262 Difficulty in walking, not elsewhere classified: Secondary | ICD-10-CM | POA: Diagnosis not present

## 2023-12-03 DIAGNOSIS — M25572 Pain in left ankle and joints of left foot: Secondary | ICD-10-CM | POA: Diagnosis not present

## 2023-12-08 DIAGNOSIS — R262 Difficulty in walking, not elsewhere classified: Secondary | ICD-10-CM | POA: Diagnosis not present

## 2023-12-08 DIAGNOSIS — Z4789 Encounter for other orthopedic aftercare: Secondary | ICD-10-CM | POA: Diagnosis not present

## 2023-12-08 DIAGNOSIS — M25572 Pain in left ankle and joints of left foot: Secondary | ICD-10-CM | POA: Diagnosis not present

## 2023-12-16 DIAGNOSIS — M25572 Pain in left ankle and joints of left foot: Secondary | ICD-10-CM | POA: Diagnosis not present

## 2023-12-16 DIAGNOSIS — R262 Difficulty in walking, not elsewhere classified: Secondary | ICD-10-CM | POA: Diagnosis not present

## 2023-12-16 DIAGNOSIS — Z4789 Encounter for other orthopedic aftercare: Secondary | ICD-10-CM | POA: Diagnosis not present

## 2023-12-21 DIAGNOSIS — R262 Difficulty in walking, not elsewhere classified: Secondary | ICD-10-CM | POA: Diagnosis not present

## 2023-12-21 DIAGNOSIS — Z4789 Encounter for other orthopedic aftercare: Secondary | ICD-10-CM | POA: Diagnosis not present

## 2023-12-21 DIAGNOSIS — M25572 Pain in left ankle and joints of left foot: Secondary | ICD-10-CM | POA: Diagnosis not present

## 2023-12-23 DIAGNOSIS — Z4789 Encounter for other orthopedic aftercare: Secondary | ICD-10-CM | POA: Diagnosis not present

## 2023-12-23 DIAGNOSIS — M25572 Pain in left ankle and joints of left foot: Secondary | ICD-10-CM | POA: Diagnosis not present

## 2023-12-23 DIAGNOSIS — R262 Difficulty in walking, not elsewhere classified: Secondary | ICD-10-CM | POA: Diagnosis not present

## 2023-12-28 DIAGNOSIS — R262 Difficulty in walking, not elsewhere classified: Secondary | ICD-10-CM | POA: Diagnosis not present

## 2023-12-28 DIAGNOSIS — M25572 Pain in left ankle and joints of left foot: Secondary | ICD-10-CM | POA: Diagnosis not present

## 2023-12-28 DIAGNOSIS — Z4789 Encounter for other orthopedic aftercare: Secondary | ICD-10-CM | POA: Diagnosis not present

## 2023-12-31 ENCOUNTER — Encounter: Payer: 59 | Admitting: Podiatry

## 2024-01-04 DIAGNOSIS — Z4789 Encounter for other orthopedic aftercare: Secondary | ICD-10-CM | POA: Diagnosis not present

## 2024-01-04 DIAGNOSIS — R262 Difficulty in walking, not elsewhere classified: Secondary | ICD-10-CM | POA: Diagnosis not present

## 2024-01-04 DIAGNOSIS — M25572 Pain in left ankle and joints of left foot: Secondary | ICD-10-CM | POA: Diagnosis not present

## 2024-01-05 DIAGNOSIS — Z4789 Encounter for other orthopedic aftercare: Secondary | ICD-10-CM | POA: Diagnosis not present

## 2024-01-05 DIAGNOSIS — R262 Difficulty in walking, not elsewhere classified: Secondary | ICD-10-CM | POA: Diagnosis not present

## 2024-01-05 DIAGNOSIS — M25572 Pain in left ankle and joints of left foot: Secondary | ICD-10-CM | POA: Diagnosis not present

## 2024-01-07 DIAGNOSIS — H353231 Exudative age-related macular degeneration, bilateral, with active choroidal neovascularization: Secondary | ICD-10-CM | POA: Diagnosis not present

## 2024-01-11 DIAGNOSIS — Z4789 Encounter for other orthopedic aftercare: Secondary | ICD-10-CM | POA: Diagnosis not present

## 2024-01-11 DIAGNOSIS — R262 Difficulty in walking, not elsewhere classified: Secondary | ICD-10-CM | POA: Diagnosis not present

## 2024-01-11 DIAGNOSIS — M25572 Pain in left ankle and joints of left foot: Secondary | ICD-10-CM | POA: Diagnosis not present

## 2024-01-12 ENCOUNTER — Ambulatory Visit (INDEPENDENT_AMBULATORY_CARE_PROVIDER_SITE_OTHER): Payer: 59 | Admitting: Podiatry

## 2024-01-12 DIAGNOSIS — M7662 Achilles tendinitis, left leg: Secondary | ICD-10-CM | POA: Diagnosis not present

## 2024-01-12 NOTE — Progress Notes (Signed)
   Chief Complaint  Patient presents with   Routine Post Op    DOS 10/12/23 --- LEFT GASTROCNEMIUS RECESSION WITH HAGLAND RESECTION WITH REPAIR OF ACHILLES TENDON WITH FIXATION    Subjective:  Patient presents today status post posterior heel spur resection with repair of Achilles tendon and gastroc aponeurosis lengthening left.  DOS: 10/12/2023.  Patient doing well.  NWB in the cam boot with crutches.  Presenting for staple removal  Past Medical History:  Diagnosis Date   Anxiety    Depression    GERD (gastroesophageal reflux disease)    Hypertension     Past Surgical History:  Procedure Laterality Date   ABDOMINAL HYSTERECTOMY     CATARACT EXTRACTION W/PHACO Right 05/26/2017   Procedure: CATARACT EXTRACTION PHACO AND INTRAOCULAR LENS PLACEMENT (IOC);  Surgeon: Galen Manila, MD;  Location: [facility] ORS;  Service: Ophthalmology;  Laterality: Right;  [place] 00:32 AP% 14.3 CDE 4.64 Fluid pack lot # [id number]   COLONOSCOPY WITH PROPOFOL N/A 08/25/2022   Procedure: COLONOSCOPY WITH PROPOFOL;  Surgeon: Toney Reil, MD;  Location: [facility] ENDOSCOPY;  Service: Gastroenterology;  Laterality: N/A;   TENDON REPAIR Left    ARM    No Known Allergies  Objective/Physical Exam Neurovascular status intact.  Incision well coapted with staples intact. No sign of infectious process noted. No dehiscence. No active bleeding noted.  No edema.  Overall well-healing surgical foot   Assessment: 1. s/p posterior heel spur resection with repair of Achilles tendon the left.  Gastric aponeurosis lengthening left. DOS: 10/12/2023.    Plan of Care:  - Clinically doing much better.  Incisions have completely reepithelialized.  Patient is officially discharged from my care she is to return to regular activities no restrictions she states understanding and will do so immediately

## 2024-01-13 DIAGNOSIS — R262 Difficulty in walking, not elsewhere classified: Secondary | ICD-10-CM | POA: Diagnosis not present

## 2024-01-13 DIAGNOSIS — Z4789 Encounter for other orthopedic aftercare: Secondary | ICD-10-CM | POA: Diagnosis not present

## 2024-01-13 DIAGNOSIS — M25572 Pain in left ankle and joints of left foot: Secondary | ICD-10-CM | POA: Diagnosis not present

## 2024-01-27 DIAGNOSIS — M25572 Pain in left ankle and joints of left foot: Secondary | ICD-10-CM | POA: Diagnosis not present

## 2024-01-27 DIAGNOSIS — R262 Difficulty in walking, not elsewhere classified: Secondary | ICD-10-CM | POA: Diagnosis not present

## 2024-01-27 DIAGNOSIS — Z4789 Encounter for other orthopedic aftercare: Secondary | ICD-10-CM | POA: Diagnosis not present

## 2024-01-29 DIAGNOSIS — H353212 Exudative age-related macular degeneration, right eye, with inactive choroidal neovascularization: Secondary | ICD-10-CM | POA: Diagnosis not present

## 2024-01-29 DIAGNOSIS — H43813 Vitreous degeneration, bilateral: Secondary | ICD-10-CM | POA: Diagnosis not present

## 2024-01-29 DIAGNOSIS — H2512 Age-related nuclear cataract, left eye: Secondary | ICD-10-CM | POA: Diagnosis not present

## 2024-01-29 DIAGNOSIS — H353231 Exudative age-related macular degeneration, bilateral, with active choroidal neovascularization: Secondary | ICD-10-CM | POA: Diagnosis not present

## 2024-03-23 ENCOUNTER — Emergency Department

## 2024-03-23 ENCOUNTER — Encounter: Payer: Self-pay | Admitting: Emergency Medicine

## 2024-03-23 ENCOUNTER — Other Ambulatory Visit: Payer: Self-pay

## 2024-03-23 ENCOUNTER — Emergency Department
Admission: EM | Admit: 2024-03-23 | Discharge: 2024-03-23 | Disposition: A | Discharge status: Home / Self Care | Attending: Emergency Medicine | Admitting: Emergency Medicine

## 2024-03-23 DIAGNOSIS — K5792 Diverticulitis of intestine, part unspecified, without perforation or abscess without bleeding: Secondary | ICD-10-CM | POA: Insufficient documentation

## 2024-03-23 DIAGNOSIS — I10 Essential (primary) hypertension: Secondary | ICD-10-CM | POA: Diagnosis not present

## 2024-03-23 DIAGNOSIS — D72829 Elevated white blood cell count, unspecified: Secondary | ICD-10-CM | POA: Diagnosis not present

## 2024-03-23 DIAGNOSIS — K5730 Diverticulosis of large intestine without perforation or abscess without bleeding: Secondary | ICD-10-CM | POA: Diagnosis not present

## 2024-03-23 DIAGNOSIS — R1032 Left lower quadrant pain: Secondary | ICD-10-CM | POA: Diagnosis not present

## 2024-03-23 LAB — COMPREHENSIVE METABOLIC PANEL WITH GFR
ALT: 15 U/L (ref 0–44)
AST: 17 U/L (ref 15–41)
Albumin: 3.7 g/dL (ref 3.5–5.0)
Alkaline Phosphatase: 63 U/L (ref 38–126)
Anion gap: 11 (ref 5–15)
BUN: 15 mg/dL (ref 8–23)
CO2: 21 mmol/L — ABNORMAL LOW (ref 22–32)
Calcium: 9.2 mg/dL (ref 8.9–10.3)
Chloride: 104 mmol/L (ref 98–111)
Creatinine, Ser: 1.24 mg/dL — ABNORMAL HIGH (ref 0.44–1.00)
GFR, Estimated: 46 mL/min — ABNORMAL LOW (ref >60)
Glucose, Bld: 104 mg/dL — ABNORMAL HIGH (ref 70–99)
Potassium: 3.7 mmol/L (ref 3.5–5.1)
Sodium: 136 mmol/L (ref 135–145)
Total Bilirubin: 0.9 mg/dL (ref 0.0–1.2)
Total Protein: 7.2 g/dL (ref 6.5–8.1)

## 2024-03-23 LAB — CBC
HCT: 44.1 % (ref 36.0–46.0)
Hemoglobin: 14.9 g/dL (ref 12.0–15.0)
MCH: 31.5 pg (ref 26.0–34.0)
MCHC: 33.8 g/dL (ref 30.0–36.0)
MCV: 93.2 fL (ref 80.0–100.0)
Platelets: 236 10*3/uL (ref 150–400)
RBC: 4.73 MIL/uL (ref 3.87–5.11)
RDW: 14.1 % (ref 11.5–15.5)
WBC: 13.9 10*3/uL — ABNORMAL HIGH (ref 4.0–10.5)
nRBC: 0 % (ref 0.0–0.2)

## 2024-03-23 LAB — URINALYSIS, ROUTINE W REFLEX MICROSCOPIC
Bilirubin Urine: NEGATIVE
Glucose, UA: NEGATIVE mg/dL
Hgb urine dipstick: NEGATIVE
Ketones, ur: NEGATIVE mg/dL
Leukocytes,Ua: NEGATIVE
Nitrite: NEGATIVE
Protein, ur: NEGATIVE mg/dL
Specific Gravity, Urine: 1.008 (ref 1.005–1.030)
pH: 5 (ref 5.0–8.0)

## 2024-03-23 LAB — LIPASE, BLOOD: Lipase: 31 U/L (ref 11–51)

## 2024-03-23 MED ORDER — AMOXICILLIN-POT CLAVULANATE 875-125 MG PO TABS: 1.0000 | ORAL_TABLET | Freq: Two times a day (BID) | ORAL | 0 refills | Status: DC

## 2024-03-23 MED ORDER — ONDANSETRON 4 MG PO TBDP: 4.0000 mg | ORAL_TABLET | Freq: Three times a day (TID) | ORAL | 0 refills | Status: AC | PRN

## 2024-03-23 MED ORDER — ONDANSETRON HCL 4 MG/2ML IJ SOLN
4.0000 mg | Freq: Once | INTRAMUSCULAR | Status: AC
  Administered 2024-03-23: 4 mg via INTRAVENOUS
  Filled 2024-03-23: qty 2

## 2024-03-23 MED ORDER — AMOXICILLIN-POT CLAVULANATE 875-125 MG PO TABS
1.0000 | ORAL_TABLET | Freq: Once | ORAL | Status: AC
  Administered 2024-03-23: 1 via ORAL
  Filled 2024-03-23: qty 1

## 2024-03-23 MED ORDER — KETOROLAC TROMETHAMINE 30 MG/ML IJ SOLN
15.0000 mg | Freq: Once | INTRAMUSCULAR | Status: AC
  Administered 2024-03-23: 15 mg via INTRAVENOUS
  Filled 2024-03-23: qty 1

## 2024-03-23 MED ORDER — IOHEXOL 300 MG/ML  SOLN
100.0000 mL | Freq: Once | INTRAMUSCULAR | Status: AC | PRN
  Administered 2024-03-23: 100 mL via INTRAVENOUS

## 2024-03-23 NOTE — ED Notes (Signed)
 Patient transported to CT

## 2024-03-23 NOTE — ED Notes (Signed)
 Assisted pt to in room commode. Pt ambulated independently with steady gait.

## 2024-03-23 NOTE — ED Triage Notes (Signed)
 Pt presents to the ED via POV with complaints of LLQ pain that started yesterday. Hx of recurrent diverticulitis. Rates the pain 8/10. A&Ox4 at this time. Denies CP or SOB.

## 2024-03-23 NOTE — ED Provider Notes (Signed)
 [facility] Provider Note    Event Date/Time   First MD Initiated Contact with Patient 03/23/24 2046     (approximate)  History   Chief Complaint: Abdominal Pain  HPI  Carmen Buckley is a 73 y.o. female with a past medical anxiety, gastric reflux, hypertension, presents the emergency department for left lower quadrant abdominal pain.  According to the patient of the last 2 days she has been experiencing pain in the left lower quadrant as well as subjective fever/chills at home.  Patient states nausea but denies any vomiting.  No diarrhea.  Patient has a history of diverticulitis twice previously 1 in 2003 and again in 2007.  Patient states this feels similar to those events.  No urinary symptoms.  Physical Exam   Triage Vital Signs: ED Triage Vitals  Encounter Vitals Group     BP 03/23/24 2032 (!) 132/113     Systolic BP Percentile --      Diastolic BP Percentile --      Pulse Rate 03/23/24 2032 (!) 105     Resp 03/23/24 2032 18     Temp 03/23/24 2032 99 F (37.2 C)     Temp Source 03/23/24 2032 Oral     SpO2 03/23/24 2032 100 %     Weight 03/23/24 2034 172 lb 6.4 oz (78.2 kg)     Height 03/23/24 2034 5' (1.524 m)     Head Circumference --      Peak Flow --      Pain Score 03/23/24 2033 8     Pain Loc --      Pain Education --      Exclude from Growth Chart --     Most recent vital signs: Vitals:   03/23/24 2032  BP: (!) 132/113  Pulse: (!) 105  Resp: 18  Temp: 99 F (37.2 C)  SpO2: 100%    General: Awake, no distress.  CV:  Good peripheral perfusion.  Regular rate and rhythm  Resp:  Normal effort.  Equal breath sounds bilaterally.  Abd:  No distention.  Soft, moderate left lower quadrant tenderness to palpation without rebound or guarding.   ED Results / Procedures / Treatments   RADIOLOGY  I have reviewed interpreted CT images.  No obvious significant abnormality seen on my evaluation. Radiology has read the CT scan is  consistent with acute diverticulitis of the sigmoid colon.   MEDICATIONS ORDERED IN ED: Medications  ketorolac (TORADOL) 30 MG/ML injection 15 mg (has no administration in time range)  ondansetron (ZOFRAN) injection 4 mg (has no administration in time range)     IMPRESSION / MDM / ASSESSMENT AND PLAN / ED COURSE  I reviewed the triage vital signs and the nursing notes.  Patient's presentation is most consistent with acute presentation with potential threat to life or bodily function.  Patient presents to the Emergency Department for left lower quadrant abdominal pain ongoing over the last 2 days as well as subjective fever at home.  Overall the patient appears well does have moderate tenderness in left lower quadrant otherwise reassuring physical exam no rebound or guarding.  Patient has 2 episodes of diverticulitis previously 1 in 2003 and again in 2007 per patient.  We will check labs including CBC chemistry urinalysis lipase we will obtain a CT scan abdomen pelvis to further evaluate.  Patient agreeable to plan of care.  Will treat with Toradol and Zofran while awaiting CT results.  Differential is quite broad at  this time would include diverticulitis, colitis, UTI, pyelonephritis among other intra-abdominal pathology.  Patient CT scan is consistent with acute diverticulitis no perforation or abscess.  Patient's lab work has resulted showing a slight leukocytosis 13,900 reassuring chemistry, reassuring urinalysis and a normal lipase.  Will place the patient on Augmentin twice daily at the patient follow-up with her doctor.  Patient agreeable to plan of care.  FINAL CLINICAL IMPRESSION(S) / ED DIAGNOSES   Left lower quadrant abdominal pain Diverticulitis  Note:  This document was prepared using Dragon voice recognition software and may include unintentional dictation errors.   Ruth Cove, MD 03/23/24 2229

## 2024-03-23 NOTE — Discharge Instructions (Addendum)
 Please take antibiotic as prescribed for its entire course.  Please take nausea medication as needed, as written.  Return to the emergency department for any acute worsening of abdominal pain, fever or any other symptom personally concerning to yourself.  Otherwise please follow-up with your doctor in the next several days for recheck/reevaluation.

## 2024-05-26 DIAGNOSIS — H353212 Exudative age-related macular degeneration, right eye, with inactive choroidal neovascularization: Secondary | ICD-10-CM | POA: Diagnosis not present

## 2024-05-26 DIAGNOSIS — H353231 Exudative age-related macular degeneration, bilateral, with active choroidal neovascularization: Secondary | ICD-10-CM | POA: Diagnosis not present

## 2024-07-29 DIAGNOSIS — H353212 Exudative age-related macular degeneration, right eye, with inactive choroidal neovascularization: Secondary | ICD-10-CM | POA: Diagnosis not present

## 2024-10-08 NOTE — Patient Instructions (Addendum)
Please call to schedule your mammogram and/or bone density: [facility]  Address: [address], [place], [place] [address] Phone: [phone]  [facility] [place] [address] [place],  [place]  [address] Phone: [phone]    DASH Eating Plan DASH stands for Dietary Approaches to Stop Hypertension. The DASH eating plan is a healthy eating plan that has been shown to: Lower high blood pressure (hypertension). Reduce your risk for type 2 diabetes, heart disease, and stroke. Help with weight loss. What are tips for following this plan? Reading food labels Check food labels for the amount of salt (sodium) per serving. Choose foods with less than 5 percent of the Daily Value (DV) of sodium. In general, foods with less than 300 milligrams (mg) of sodium per serving fit into this eating plan. To find whole grains, look for the word "whole" as the first word in the ingredient list. Shopping Buy products labeled as "low-sodium" or "no salt added." Buy fresh foods. Avoid canned foods and pre-made or frozen meals. Cooking Try not to add salt when you cook. Use salt-free seasonings or herbs instead of table salt or sea salt. Check with your health care provider or pharmacist before using salt substitutes. Do not fry foods. Cook foods in healthy ways, such as baking, boiling, grilling, roasting, or broiling. Cook using oils that are good for your heart. These include olive, canola, avocado, soybean, and sunflower oil. Meal planning  Eat a balanced diet. This should include: 4 or more servings of fruits and 4 or more servings of vegetables each day. Try to fill half of your plate with fruits and vegetables. 6-8 servings of whole grains each day. 6 or less servings of lean meat, poultry, or fish each day. 1 oz is 1 serving. A 3 oz (85 g) serving of meat is about the same size as the palm of your hand. One egg is 1 oz (28  g). 2-3 servings of low-fat dairy each day. One serving is 1 cup (237 mL). 1 serving of nuts, seeds, or beans 5 times each week. 2-3 servings of heart-healthy fats. Healthy fats called omega-3 fatty acids are found in foods such as walnuts, flaxseeds, fortified milks, and eggs. These fats are also found in cold-water fish, such as sardines, salmon, and mackerel. Limit how much you eat of: Canned or prepackaged foods. Food that is high in trans fat, such as fried foods. Food that is high in saturated fat, such as fatty meat. Desserts and other sweets, sugary drinks, and other foods with added sugar. Full-fat dairy products. Do not salt foods before eating. Do not eat more than 4 egg yolks a week. Try to eat at least 2 vegetarian meals a week. Eat more home-cooked food and less restaurant, buffet, and fast food. Lifestyle When eating at a restaurant, ask if your food can be made with less salt or no salt. If you drink alcohol: Limit how much you have to: 0-1 drink a day if you are female. 0-2 drinks a day if you are female. Know how much alcohol is in your drink. In the [place] one drink is one 12 oz bottle of beer (355 mL), one 5 oz glass of wine (148 mL), or one 1 oz glass of hard liquor (44 mL). General information Avoid eating more than 2,300 mg of salt a day. If you have hypertension, you may need to reduce your sodium intake to 1,500 mg   a day. Work with your provider to stay at a healthy body weight or lose weight. Ask what the best weight range is for you. On most days of the week, get at least 30 minutes of exercise that causes your heart to beat faster. This may include walking, swimming, or biking. Work with your provider or dietitian to adjust your eating plan to meet your specific calorie needs. What foods should I eat? Fruits All fresh, dried, or frozen fruit. Canned fruits that are in their natural juice and do not have sugar added to them. Vegetables Fresh or frozen vegetables  that are raw, steamed, roasted, or grilled. Low-sodium or reduced-sodium tomato and vegetable juice. Low-sodium or reduced-sodium tomato sauce and tomato paste. Low-sodium or reduced-sodium canned vegetables. Grains Whole-grain or whole-wheat bread. Whole-grain or whole-wheat pasta. Brown rice. Oatmeal. Quinoa. Bulgur. Whole-grain and low-sodium cereals. Pita bread. Low-fat, low-sodium crackers. Whole-wheat flour tortillas. Meats and other proteins Skinless chicken or turkey. Ground chicken or turkey. Pork with fat trimmed off. Fish and seafood. Egg whites. Dried beans, peas, or lentils. Unsalted nuts, nut butters, and seeds. Unsalted canned beans. Lean cuts of beef with fat trimmed off. Low-sodium, lean precooked or cured meat, such as sausages or meat loaves. Dairy Low-fat (1%) or fat-free (skim) milk. Reduced-fat, low-fat, or fat-free cheeses. Nonfat, low-sodium ricotta or cottage cheese. Low-fat or nonfat yogurt. Low-fat, low-sodium cheese. Fats and oils Soft margarine without trans fats. Vegetable oil. Reduced-fat, low-fat, or light mayonnaise and salad dressings (reduced-sodium). Canola, safflower, olive, avocado, soybean, and sunflower oils. Avocado. Seasonings and condiments Herbs. Spices. Seasoning mixes without salt. Other foods Unsalted popcorn and pretzels. Fat-free sweets. The items listed above may not be all the foods and drinks you can have. Talk to a dietitian to learn more. What foods should I avoid? Fruits Canned fruit in a light or heavy syrup. Fried fruit. Fruit in cream or butter sauce. Vegetables Creamed or fried vegetables. Vegetables in a cheese sauce. Regular canned vegetables that are not marked as low-sodium or reduced-sodium. Regular canned tomato sauce and paste that are not marked as low-sodium or reduced-sodium. Regular tomato and vegetable juices that are not marked as low-sodium or reduced-sodium. Pickles. Olives. Grains Baked goods made with fat, such as  croissants, muffins, or some breads. Dry pasta or rice meal packs. Meats and other proteins Fatty cuts of meat. Ribs. Fried meat. Bacon. Bologna, salami, and other precooked or cured meats, such as sausages or meat loaves, that are not lean and low in sodium. Fat from the back of a pig (fatback). Bratwurst. Salted nuts and seeds. Canned beans with added salt. Canned or smoked fish. Whole eggs or egg yolks. Chicken or turkey with skin. Dairy Whole or 2% milk, cream, and half-and-half. Whole or full-fat cream cheese. Whole-fat or sweetened yogurt. Full-fat cheese. Nondairy creamers. Whipped toppings. Processed cheese and cheese spreads. Fats and oils Butter. Stick margarine. Lard. Shortening. Ghee. Bacon fat. Tropical oils, such as coconut, palm kernel, or palm oil. Seasonings and condiments Onion salt, garlic salt, seasoned salt, table salt, and sea salt. Worcestershire sauce. Tartar sauce. Barbecue sauce. Teriyaki sauce. Soy sauce, including reduced-sodium soy sauce. Steak sauce. Canned and packaged gravies. Fish sauce. Oyster sauce. Cocktail sauce. Store-bought horseradish. Ketchup. Mustard. Meat flavorings and tenderizers. Bouillon cubes. Hot sauces. Pre-made or packaged marinades. Pre-made or packaged taco seasonings. Relishes. Regular salad dressings. Other foods Salted popcorn and pretzels. The items listed above may not be all the foods and drinks you should avoid. Talk to a   dietitian to learn more. Where to find more information National Heart, Lung, and Blood Institute (NHLBI): nhlbi.nih.gov American Heart Association (AHA): heart.org Academy of Nutrition and Dietetics: eatright.org National Kidney Foundation (NKF): kidney.org This information is not intended to replace advice given to you by your health care provider. Make sure you discuss any questions you have with your health care provider. Document Revised: 11/13/2022 Document Reviewed: 11/13/2022 Elsevier Patient Education  2024  Elsevier Inc.  

## 2024-10-20 ENCOUNTER — Ambulatory Visit: Admitting: Nurse Practitioner

## 2024-10-20 ENCOUNTER — Other Ambulatory Visit: Payer: Self-pay | Admitting: Nurse Practitioner

## 2024-10-20 ENCOUNTER — Encounter: Payer: Self-pay | Admitting: Nurse Practitioner

## 2024-10-20 VITALS — BP 136/80 | HR 66 | Temp 97.9°F | Ht 61.8 in | Wt 182.6 lb

## 2024-10-20 DIAGNOSIS — Z7689 Persons encountering health services in other specified circumstances: Secondary | ICD-10-CM | POA: Diagnosis not present

## 2024-10-20 DIAGNOSIS — N1831 Chronic kidney disease, stage 3a: Secondary | ICD-10-CM | POA: Insufficient documentation

## 2024-10-20 DIAGNOSIS — E782 Mixed hyperlipidemia: Secondary | ICD-10-CM | POA: Insufficient documentation

## 2024-10-20 DIAGNOSIS — I10 Essential (primary) hypertension: Secondary | ICD-10-CM | POA: Diagnosis not present

## 2024-10-20 DIAGNOSIS — F17210 Nicotine dependence, cigarettes, uncomplicated: Secondary | ICD-10-CM

## 2024-10-20 DIAGNOSIS — Z23 Encounter for immunization: Secondary | ICD-10-CM | POA: Diagnosis not present

## 2024-10-20 DIAGNOSIS — Z780 Asymptomatic menopausal state: Secondary | ICD-10-CM

## 2024-10-20 DIAGNOSIS — E669 Obesity, unspecified: Secondary | ICD-10-CM | POA: Diagnosis not present

## 2024-10-20 DIAGNOSIS — R7301 Impaired fasting glucose: Secondary | ICD-10-CM | POA: Diagnosis not present

## 2024-10-20 DIAGNOSIS — Z1159 Encounter for screening for other viral diseases: Secondary | ICD-10-CM

## 2024-10-20 DIAGNOSIS — Z1231 Encounter for screening mammogram for malignant neoplasm of breast: Secondary | ICD-10-CM

## 2024-10-20 LAB — MICROALBUMIN, URINE WAIVED
Creatinine, Urine Waived: 300 mg/dL (ref 10–300)
Microalb, Ur Waived: 80 mg/L — ABNORMAL HIGH (ref 0–19)

## 2024-10-20 MED ORDER — METOPROLOL TARTRATE 50 MG PO TABS: 50.0000 mg | ORAL_TABLET | Freq: Every day | ORAL | 3 refills | Status: AC

## 2024-10-20 NOTE — Progress Notes (Signed)
 New Patient Office Visit  Subjective    Patient ID: VIOLIA KNOPF, female    DOB: 12-16-1950  Age: 73 y.o. MRN: [id number]  CC:  Chief Complaint  Patient presents with   Hyperlipidemia   Hypertension    HPI Carmen Buckley presents for new patient visit to establish care.  Introduced to publishing rights manager role and practice setting.  All questions answered.  Discussed provider/patient relationship and expectations. Previously followed by Dr. Britta, has not seen him in one year.    HYPERTENSION / HYPERLIPIDEMIA Taking Zestoretic  20-25 MG daily, Metoprolol, and Atorvastatin  10 MG. Is a smoker, smokes 2 packs per week. Started smoking at age 62. Never has been a PPD smoker. Would like to get lung cancer screening. Satisfied with current treatment? yes Duration of hypertension: chronic BP monitoring frequency: not checking BP range:  BP medication side effects: no Duration of hyperlipidemia: chronic Cholesterol medication side effects: no Cholesterol supplements: none Medication compliance: good compliance Aspirin: no Recent stressors: no Recurrent headaches: no Visual changes: no Palpitations: no Dyspnea: no Chest pain: no Lower extremity edema: occasional to left ankle, past surgery here Dizzy/lightheaded: no   GERD Takes Omeprazole daily. GERD control status: controlled Satisfied with current treatment? yes Heartburn frequency: none with medication Medication side effects: no  Medication compliance: stable Previous GERD medications: OTC medications Alleviatiating factors: Omeprazole Aggravating factors: spicy foods Dysphagia: no Odynophagia:  no Hematemesis: no Blood in stool: no EGD: yes   CHRONIC KIDNEY DISEASE Noticed in hospital recently, May 2025. CKD status: stable Medications renally dose: yes Previous renal evaluation: no Pneumovax:  Up to Date Influenza Vaccine:  Up to Date   Sometimes feels depressed over holidays and has had some recent family  members and friends pass due to cancer.    10/20/2024   11:05 AM 07/30/2022    9:02 AM 08/09/2021   10:59 AM 05/22/2021   11:51 AM  Depression screen PHQ 2/9  Decreased Interest 3 0 0 0  Down, Depressed, Hopeless 0 0 0 0  PHQ - 2 Score 3 0 0 0  Altered sleeping 3     Tired, decreased energy 3     Change in appetite 0     Feeling bad or failure about yourself  0     Trouble concentrating 3     Moving slowly or fidgety/restless 0     Suicidal thoughts 0     PHQ-9 Score 12     Difficult doing work/chores Not difficult at all          10/20/2024   11:05 AM  GAD 7 : Generalized Anxiety Score  Nervous, Anxious, on Edge 0  Control/stop worrying 1  Worry too much - different things 1  Trouble relaxing 0  Restless 0  Easily annoyed or irritable 0  Afraid - awful might happen 1  Total GAD 7 Score 3  Anxiety Difficulty Not difficult at all   Outpatient Encounter Medications as of 10/20/2024  Medication Sig   atorvastatin  (LIPITOR) 10 MG tablet Take 1 tablet (10 mg total) by mouth daily.   lisinopril -hydrochlorothiazide  (ZESTORETIC ) 20-25 MG tablet Take 1 tablet by mouth daily. 40/25 MG HS   loratadine  (CLARITIN ) 10 MG tablet Take 1 tablet (10 mg total) by mouth daily.   omeprazole (PRILOSEC) 20 MG capsule TAKE 1 TABLET BY MOUTH DAILY   ondansetron  (ZOFRAN -ODT) 4 MG disintegrating tablet Take 1 tablet (4 mg total) by mouth every 8 (eight) hours as needed for nausea or  vomiting.   [DISCONTINUED] rivaroxaban  (XARELTO ) 10 MG TABS tablet Take 1 tablet (10 mg total) by mouth daily.   metoprolol tartrate (LOPRESSOR) 50 MG tablet Take 1 tablet (50 mg total) by mouth daily.   [DISCONTINUED] amoxicillin -clavulanate (AUGMENTIN ) 875-125 MG tablet Take 1 tablet by mouth 2 (two) times daily.   [DISCONTINUED] ibuprofen  (ADVIL ) 800 MG tablet Take 1 tablet (800 mg total) by mouth every 6 (six) hours as needed.   [DISCONTINUED] ibuprofen  (ADVIL ) 800 MG tablet Take 1 tablet (800 mg total) by mouth every  6 (six) hours as needed. (Patient not taking: Reported on 10/20/2024)   [DISCONTINUED] meloxicam  (MOBIC ) 15 MG tablet Take 1 tablet (15 mg total) by mouth daily.   [DISCONTINUED] methylPREDNISolone  (MEDROL  DOSEPAK) 4 MG TBPK tablet Medrol  dose pack   4 mg   as directed (Patient not taking: Reported on 11/19/2023)   [DISCONTINUED] methylPREDNISolone  (MEDROL  DOSEPAK) 4 MG TBPK tablet Take as directed (Patient not taking: Reported on 11/19/2023)   [DISCONTINUED] metoprolol tartrate (LOPRESSOR) 50 MG tablet TAKE 1 TABLET BY MOUTH DAILY (Patient not taking: Reported on 10/20/2024)   [DISCONTINUED] oxyCODONE -acetaminophen  (PERCOCET) 5-325 MG tablet Take 1 tablet by mouth every 4 (four) hours as needed for severe pain. (Patient not taking: Reported on 11/19/2023)   [DISCONTINUED] oxyCODONE -acetaminophen  (PERCOCET) 5-325 MG tablet Take 1 tablet by mouth every 4 (four) hours as needed for severe pain (pain score 7-10). (Patient not taking: Reported on 11/19/2023)   [DISCONTINUED] loratadine  (CLARITIN ) tablet 10 mg    No facility-administered encounter medications on file as of 10/20/2024.    Past Medical History:  Diagnosis Date   Anxiety    Depression    GERD (gastroesophageal reflux disease)    Hyperlipidemia    Hypertension     Past Surgical History:  Procedure Laterality Date   ACHILLES TENDON REPAIR Left    CATARACT EXTRACTION W/PHACO Right 05/26/2017   Procedure: CATARACT EXTRACTION PHACO AND INTRAOCULAR LENS PLACEMENT (IOC);  Surgeon: Jaye Fallow, MD;  Location: [facility] ORS;  Service: Ophthalmology;  Laterality: Right;  US  00:32 AP% 14.3 CDE 4.64 Fluid pack lot # [id number]   COLONOSCOPY WITH PROPOFOL  N/A 08/25/2022   Procedure: COLONOSCOPY WITH PROPOFOL ;  Surgeon: Unk Corinn Skiff, MD;  Location: [facility] ENDOSCOPY;  Service: Gastroenterology;  Laterality: N/A;   TENDON REPAIR Left    ARM   TOTAL ABDOMINAL HYSTERECTOMY W/ BILATERAL SALPINGOOPHORECTOMY      Family History  Problem Relation  Age of Onset   Diabetes Mother    Heart disease Mother    Pneumonia Father    Cancer Sister    Uterine cancer Sister    Diabetes Sister    Heart disease Brother    Cancer Son     Social History   Socioeconomic History   Marital status: Single    Spouse name: Not on file   Number of children: Not on file   Years of education: Not on file   Highest education level: Not on file  Occupational History   Not on file  Tobacco Use   Smoking status: Some Days    Current packs/day: 0.50    Types: Cigarettes   Smokeless tobacco: Never  Vaping Use   Vaping status: Never Used  Substance and Sexual Activity   Alcohol use: No   Drug use: No   Sexual activity: Not Currently  Other Topics Concern   Not on file  Social History Narrative   Not on file   Social Drivers of Health   Tobacco  Use: High Risk (10/20/2024)   Patient History    Smoking Tobacco Use: Some Days    Smokeless Tobacco Use: Never    Passive Exposure: Not on file  Financial Resource Strain: Low Risk (10/20/2024)   Overall Financial Resource Strain (CARDIA)    Difficulty of Paying Living Expenses: Not hard at all  Food Insecurity: No Food Insecurity (10/20/2024)   Epic    Worried About Programme Researcher, Broadcasting/film/video in the Last Year: Never true    Ran Out of Food in the Last Year: Never true  Transportation Needs: No Transportation Needs (10/20/2024)   Epic    Lack of Transportation (Medical): No    Lack of Transportation (Non-Medical): No  Physical Activity: Insufficiently Active (10/20/2024)   Exercise Vital Sign    Days of Exercise per Week: 2 days    Minutes of Exercise per Session: 20 min  Stress: No Stress Concern Present (10/20/2024)   [employer] of Occupational Health - Occupational Stress Questionnaire    Feeling of Stress: Only a little  Social Connections: Moderately Integrated (10/20/2024)   Social Connection and Isolation Panel    Frequency of Communication with Friends and Family: More than three  times a week    Frequency of Social Gatherings with Friends and Family: More than three times a week    Attends Religious Services: More than 4 times per year    Active Member of [employer] or Organizations: Yes    Attends Banker Meetings: More than 4 times per year    Marital Status: Widowed  Intimate Partner Violence: Not At Risk (10/20/2024)   Epic    Fear of Current or Ex-Partner: No    Emotionally Abused: No    Physically Abused: No    Sexually Abused: No  Depression (PHQ2-9): High Risk (10/20/2024)   Depression (PHQ2-9)    PHQ-2 Score: 12  Alcohol Screen: Low Risk (10/20/2024)   Alcohol Screen    Last Alcohol Screening Score (AUDIT): 0  Housing: Unknown (10/20/2024)   Epic    Unable to Pay for Housing in the Last Year: No    Number of Times Moved in the Last Year: Not on file    Homeless in the Last Year: No  Utilities: Not At Risk (10/20/2024)   Epic    Threatened with loss of utilities: No  Health Literacy: Adequate Health Literacy (10/20/2024)   B1300 Health Literacy    Frequency of need for help with medical instructions: Never    Review of Systems  Constitutional:  Negative for chills, fever and malaise/fatigue.  Respiratory:  Negative for cough, shortness of breath and wheezing.   Cardiovascular:  Negative for chest pain, palpitations and leg swelling.  Gastrointestinal: Negative.   Neurological: Negative.   Psychiatric/Behavioral:  Positive for depression. Negative for suicidal ideas. The patient has insomnia. The patient is not nervous/anxious.        Objective    BP 136/80 (BP Location: Left Arm, Patient Position: Sitting, Cuff Size: Large)   Pulse 66   Temp 97.9 F (36.6 C) (Oral)   Ht 5' 1.8 (1.57 m)   Wt 182 lb 9.6 oz (82.8 kg)   SpO2 94%   BMI 33.61 kg/m   Physical Exam Vitals and nursing note reviewed.  Constitutional:      General: She is awake. She is not in acute distress.    Appearance: She is well-developed and well-groomed.  She is obese. She is not ill-appearing or toxic-appearing.  HENT:  Head: Normocephalic.     Right Ear: Hearing and external ear normal.     Left Ear: Hearing and external ear normal.  Eyes:     General: Lids are normal.        Right eye: No discharge.        Left eye: No discharge.     Conjunctiva/sclera: Conjunctivae normal.     Pupils: Pupils are equal, round, and reactive to light.  Neck:     Thyroid : No thyromegaly.     Vascular: No carotid bruit.  Cardiovascular:     Rate and Rhythm: Normal rate and regular rhythm.     Heart sounds: Normal heart sounds. No murmur heard.    No gallop.  Pulmonary:     Effort: Pulmonary effort is normal. No accessory muscle usage or respiratory distress.     Breath sounds: Normal breath sounds.  Abdominal:     General: Bowel sounds are normal. There is no distension.     Palpations: Abdomen is soft.     Tenderness: There is no abdominal tenderness.  Musculoskeletal:     Cervical back: Normal range of motion and neck supple.     Right lower leg: No edema.     Left lower leg: No edema.  Lymphadenopathy:     Cervical: No cervical adenopathy.  Skin:    General: Skin is warm and dry.  Neurological:     Mental Status: She is alert and oriented to person, place, and time.     Deep Tendon Reflexes: Reflexes are normal and symmetric.     Reflex Scores:      Brachioradialis reflexes are 2+ on the right side and 2+ on the left side.      Patellar reflexes are 2+ on the right side and 2+ on the left side. Psychiatric:        Attention and Perception: Attention normal.        Mood and Affect: Mood normal.        Speech: Speech normal.        Behavior: Behavior normal. Behavior is cooperative.        Thought Content: Thought content normal.    Last CBC Lab Results  Component Value Date   WBC 13.9 (H) 03/23/2024   HGB 14.9 03/23/2024   HCT 44.1 03/23/2024   MCV 93.2 03/23/2024   MCH 31.5 03/23/2024   RDW 14.1 03/23/2024   PLT 236  03/23/2024   Last metabolic panel Lab Results  Component Value Date   GLUCOSE 104 (H) 03/23/2024   NA 136 03/23/2024   K 3.7 03/23/2024   CL 104 03/23/2024   CO2 21 (L) 03/23/2024   BUN 15 03/23/2024   CREATININE 1.24 (H) 03/23/2024   GFRNONAA 46 (L) 03/23/2024   CALCIUM  9.2 03/23/2024   PROT 7.2 03/23/2024   ALBUMIN 3.7 03/23/2024   BILITOT 0.9 03/23/2024   ALKPHOS 63 03/23/2024   AST 17 03/23/2024   ALT 15 03/23/2024   ANIONGAP 11 03/23/2024   Last lipids Lab Results  Component Value Date   CHOL 224 (H) 05/27/2021   HDL 58 05/27/2021   LDLCALC 148 (H) 05/27/2021   TRIG 81 05/27/2021   CHOLHDL 3.9 05/27/2021      Assessment & Plan:   Problem List Items Addressed This Visit       Cardiovascular and Mediastinum   Essential hypertension   Chronic, ongoing. BP slightly above goal today, but did not take medication this morning. Recommend she monitor  BP at least a few mornings a week at home and document.  DASH diet at home.  Continue current medication regimen and adjust as needed.  Labs today: CBC, CMP, TSH, urine ALB. Urine ALB 80 December 2025.  Refills as needed.  Could consider increasing Lisinopril  dose and stopping HCTZ in future.       Relevant Medications   metoprolol tartrate (LOPRESSOR) 50 MG tablet     Genitourinary   CKD stage 3a, GFR 45-59 ml/min (HCC) - Primary   Ongoing on labs over past year. Recheck levels today. Urine ALB 80 December 2025. Continue Lisinopril  for kidney protection. May need to adjust this in future and stop HCTZ. BP goal <130/80. Recommend increase water intake and avoid Ibuprofen  use. Refer to nephrology in future if worsening function. Reduce Omeprazole use.      Relevant Orders   CBC with Differential/Platelet   Comprehensive metabolic panel with GFR   TSH   Microalbumin, Urine Waived (Completed)     Other   Obesity (BMI 30-39.9)   BMI 33.61. Recommended eating smaller high protein, low fat meals more frequently and  exercising 30 mins a day 5 times a week with a goal of 10-15lb weight loss in the next 3 months. Patient voiced their understanding and motivation to adhere to these recommendations.       Nicotine dependence, cigarettes, uncomplicated   I have recommended complete cessation of tobacco use. I have discussed various options available for assistance with tobacco cessation including over the counter methods (Nicotine gum, patch and lozenges). We also discussed prescription options (Chantix, Nicotine Inhaler / Nasal Spray). The patient is not interested in pursuing any prescription tobacco cessation options at this time. Referral for lung cancer screening per request.       Relevant Orders   Ambulatory Referral for Lung Cancer Scre   Mixed hyperlipidemia   Chronic, ongoing.  Continue current medication regimen and adjust as needed. Lipid panel today.      Relevant Medications   metoprolol tartrate (LOPRESSOR) 50 MG tablet   Other Relevant Orders   Comprehensive metabolic panel with GFR   Lipid Panel w/o Chol/HDL Ratio   Other Visit Diagnoses       IFG (impaired fasting glucose)       Noted on past labs, check A1c today.   Relevant Orders   HgB A1c     Postmenopausal estrogen deficiency       DEXA ordered and instructed how to schedule.   Relevant Orders   DG Bone Density     Need for pneumococcal 20-valent conjugate vaccination       PCV20 in office today, educated on this.   Relevant Orders   Pneumococcal conjugate vaccine 20-valent (Prevnar 20) (Completed)     Need for influenza vaccination       Flu vaccine today, educated on this.   Relevant Orders   Flu vaccine HIGH DOSE PF(Fluzone Trivalent) (Completed)     Encounter for screening mammogram for malignant neoplasm of breast       Mammogram ordered and instructed on how to schedule.   Relevant Orders   MM 3D SCREENING MAMMOGRAM BILATERAL BREAST     Need for hepatitis C screening test       Hep C screening today, educated  on this.   Relevant Orders   Hepatitis C antibody     Encounter to establish care       New to clinic, introduced to provider and clinic setting.  Return in about 3 months (around 01/18/2025) for HTN/HLD.   Hlee Fringer T Cynitha Berte, NP

## 2024-10-20 NOTE — Assessment & Plan Note (Signed)
 I have recommended complete cessation of tobacco use. I have discussed various options available for assistance with tobacco cessation including over the counter methods (Nicotine  gum, patch and lozenges). We also discussed prescription options (Chantix, Nicotine  Inhaler / Nasal Spray). The patient is not interested in pursuing any prescription tobacco cessation options at this time. Referral for lung cancer screening per request.

## 2024-10-20 NOTE — Assessment & Plan Note (Addendum)
 Ongoing on labs over past year. Recheck levels today. Urine ALB 80 December 2025. Continue Lisinopril  for kidney protection. May need to adjust this in future and stop HCTZ. BP goal <130/80. Recommend increase water intake and avoid Ibuprofen  use. Refer to nephrology in future if worsening function. Reduce Omeprazole use.

## 2024-10-20 NOTE — Assessment & Plan Note (Signed)
BMI 33.61.  Recommended eating smaller high protein, low fat meals more frequently and exercising 30 mins a day 5 times a week with a goal of 10-15lb weight loss in the next 3 months. Patient voiced their understanding and motivation to adhere to these recommendations.

## 2024-10-20 NOTE — Assessment & Plan Note (Signed)
 Chronic, ongoing.  Continue current medication regimen and adjust as needed. Lipid panel today.

## 2024-10-20 NOTE — Assessment & Plan Note (Signed)
 Chronic, ongoing. BP slightly above goal today, but did not take medication this morning. Recommend she monitor BP at least a few mornings a week at home and document.  DASH diet at home.  Continue current medication regimen and adjust as needed.  Labs today: CBC, CMP, TSH, urine ALB. Urine ALB 80 December 2025.  Refills as needed.  Could consider increasing Lisinopril  dose and stopping HCTZ in future.

## 2024-10-20 NOTE — Telephone Encounter (Unsigned)
 Copied from CRM #[id number]. Topic: Clinical - Medication Refill >> Oct 20, 2024  2:49 PM Hadassah PARAS wrote: Medication: loratadine  (CLARITIN ) 10 MG tablet   Has the patient contacted their pharmacy? No (Agent: If no, request that the patient contact the pharmacy for the refill. If patient does not wish to contact the pharmacy document the reason why and proceed with request.) (Agent: If yes, when and what did the pharmacy advise?)  This is the patient's preferred pharmacy:  [facility] [address]), [place] - [address] EUGENE OTHEL JACOBS [place]) [place] [address] Phone: [phone] Fax: [phone]  Is this the correct pharmacy for this prescription? Yes If no, delete pharmacy and type the correct one.   Has the prescription been filled recently? No  Is the patient out of the medication? Yes  Has the patient been seen for an appointment in the last year OR does the patient have an upcoming appointment? Yes  Can we respond through MyChart? Yes  Agent: Please be advised that Rx refills may take up to 3 business days. We ask that you follow-up with your pharmacy.

## 2024-10-21 ENCOUNTER — Ambulatory Visit: Payer: Self-pay | Admitting: Nurse Practitioner

## 2024-10-21 ENCOUNTER — Encounter: Payer: Self-pay | Admitting: *Deleted

## 2024-10-21 DIAGNOSIS — B1920 Unspecified viral hepatitis C without hepatic coma: Secondary | ICD-10-CM

## 2024-10-21 LAB — COMPREHENSIVE METABOLIC PANEL WITH GFR
ALT: 17 IU/L (ref 0–32)
AST: 21 IU/L (ref 0–40)
Albumin: 4.3 g/dL (ref 3.8–4.8)
Alkaline Phosphatase: 94 IU/L (ref 49–135)
BUN/Creatinine Ratio: 14 (ref 12–28)
BUN: 17 mg/dL (ref 8–27)
Bilirubin Total: 0.3 mg/dL (ref 0.0–1.2)
CO2: 24 mmol/L (ref 20–29)
Calcium: 10 mg/dL (ref 8.7–10.3)
Chloride: 107 mmol/L — ABNORMAL HIGH (ref 96–106)
Creatinine, Ser: 1.19 mg/dL — ABNORMAL HIGH (ref 0.57–1.00)
Globulin, Total: 2.5 g/dL (ref 1.5–4.5)
Glucose: 86 mg/dL (ref 70–99)
Potassium: 4.8 mmol/L (ref 3.5–5.2)
Sodium: 144 mmol/L (ref 134–144)
Total Protein: 6.8 g/dL (ref 6.0–8.5)
eGFR: 49 mL/min/1.73 — ABNORMAL LOW (ref >59)

## 2024-10-21 LAB — CBC WITH DIFFERENTIAL/PLATELET
Basophils Absolute: 0.1 x10E3/uL (ref 0.0–0.2)
Basos: 1 %
EOS (ABSOLUTE): 0.2 x10E3/uL (ref 0.0–0.4)
Eos: 3 %
Hematocrit: 45.8 % (ref 34.0–46.6)
Hemoglobin: 15.2 g/dL (ref 11.1–15.9)
Immature Grans (Abs): 0.1 x10E3/uL (ref 0.0–0.1)
Immature Granulocytes: 1 %
Lymphocytes Absolute: 3.2 x10E3/uL — ABNORMAL HIGH (ref 0.7–3.1)
Lymphs: 41 %
MCH: 31.1 pg (ref 26.6–33.0)
MCHC: 33.2 g/dL (ref 31.5–35.7)
MCV: 94 fL (ref 79–97)
Monocytes Absolute: 0.6 x10E3/uL (ref 0.1–0.9)
Monocytes: 8 %
Neutrophils Absolute: 3.6 x10E3/uL (ref 1.4–7.0)
Neutrophils: 46 %
Platelets: 266 x10E3/uL (ref 150–450)
RBC: 4.88 x10E6/uL (ref 3.77–5.28)
RDW: 13 % (ref 11.7–15.4)
WBC: 7.7 x10E3/uL (ref 3.4–10.8)

## 2024-10-21 LAB — HEPATITIS C ANTIBODY: Hep C Virus Ab: REACTIVE — AB

## 2024-10-21 LAB — LIPID PANEL W/O CHOL/HDL RATIO
Cholesterol, Total: 280 mg/dL — ABNORMAL HIGH (ref 100–199)
HDL: 58 mg/dL (ref >39)
LDL Chol Calc (NIH): 205 mg/dL — ABNORMAL HIGH (ref 0–99)
Triglycerides: 97 mg/dL (ref 0–149)
VLDL Cholesterol Cal: 17 mg/dL (ref 5–40)

## 2024-10-21 LAB — HEMOGLOBIN A1C
Est. average glucose Bld gHb Est-mCnc: 126 mg/dL
Hgb A1c MFr Bld: 6 % — ABNORMAL HIGH (ref 4.8–5.6)

## 2024-10-21 LAB — TSH: TSH: 1.15 u[IU]/mL (ref 0.450–4.500)

## 2024-10-21 MED ORDER — LISINOPRIL-HYDROCHLOROTHIAZIDE 20-25 MG PO TABS: 1.0000 | ORAL_TABLET | Freq: Every day | ORAL | 3 refills | Status: AC

## 2024-10-21 MED ORDER — LORATADINE 10 MG PO TABS: 10.0000 mg | ORAL_TABLET | Freq: Every day | ORAL | 3 refills | Status: AC

## 2024-10-21 MED ORDER — OMEPRAZOLE 20 MG PO CPDR: 20.0000 mg | DELAYED_RELEASE_CAPSULE | Freq: Every day | ORAL | 3 refills | Status: AC

## 2024-10-21 MED ORDER — ATORVASTATIN CALCIUM 10 MG PO TABS: 10.0000 mg | ORAL_TABLET | Freq: Every day | ORAL | 3 refills | Status: AC

## 2024-10-21 NOTE — Progress Notes (Signed)
 Please call patient as does not consistently check MyChart on review, labs have returned: - Blood counts are overall stable. Thyroid  level normal. - Kidney function shows Stage 3a kidney disease present. Ensure to keep Ibuprofen  products low and get good water intake daily. Liver function is normal. - Lipid panel is showing significant elevations, are you taking your Atorvastatin  daily? If so we may adjust this and change to a higher dose. - A1c is in prediabetic range at 6%, focus on healthy diet changes and regular exercise. - Hepatitis C testing returned reactive (positive). I am going to place a referral to GI to further assess this and offer recommendations. Any questions? Keep being stellar!!  Thank you for allowing me to participate in your care.  I appreciate you. Kindest regards, Neilan Rizzo

## 2024-10-21 NOTE — Addendum Note (Signed)
 Addended by: Ashland Osmer T on: 10/21/2024 02:03 PM   Modules accepted: Orders

## 2024-10-24 NOTE — Telephone Encounter (Signed)
 Requested Prescriptions  Refused Prescriptions Disp Refills   loratadine  (CLARITIN ) 10 MG tablet 90 tablet 3    Sig: Take 1 tablet (10 mg total) by mouth daily.     Ear, Nose, and Throat:  Antihistamines 2 Failed - 10/24/2024 11:06 AM      Failed - Cr in normal range and within 360 days    Creat  Date Value Ref Range Status  05/27/2021 0.89 0.50 - 1.05 mg/dL Final   Creatinine, Ser  Date Value Ref Range Status  10/20/2024 1.19 (H) 0.57 - 1.00 mg/dL Final         Passed - Valid encounter within last 12 months    Recent Outpatient Visits           4 days ago CKD stage 3a, GFR 45-59 ml/min [facility])   [facility] [place], Melanie DASEN, NP

## 2024-11-07 ENCOUNTER — Other Ambulatory Visit: Payer: Self-pay | Admitting: *Deleted

## 2024-11-07 ENCOUNTER — Telehealth: Payer: Self-pay | Admitting: *Deleted

## 2024-11-07 DIAGNOSIS — Z122 Encounter for screening for malignant neoplasm of respiratory organs: Secondary | ICD-10-CM

## 2024-11-07 DIAGNOSIS — F17210 Nicotine dependence, cigarettes, uncomplicated: Secondary | ICD-10-CM

## 2024-11-07 DIAGNOSIS — Z87891 Personal history of nicotine dependence: Secondary | ICD-10-CM

## 2024-11-07 NOTE — Telephone Encounter (Signed)
 Lung Cancer Screening Narrative/Criteria Questionnaire (Cigarette Smokers Only- No Cigars/Pipes/vapes)   Carmen Buckley   SDMV:11/14/24 8:00- [name]                                           Jul 08, 1951              LDCT: 11/15/24 9:00 [facility]    73 y.o.   Phone: [phone]  Lung Screening Narrative (confirm age 77-77 yrs Medicare / 50-80 yrs Private pay insurance)   Insurance information:[employer] [facility]   Referring Provider:Cannady   This screening involves an initial phone call with a team member from our program. It is called a shared decision making visit. The initial meeting is required by insurance and Medicare to make sure you understand the program. This appointment takes about 15-20 minutes to complete. The CT scan will completed at a separate date/time. This scan takes about 5-10 minutes to complete and you may eat and drink before and after the scan.  Criteria questions for Lung Cancer Screening:   Are you a current or former smoker? Current Age began smoking: 16   If you are a former smoker, what year did you quit smoking?  Stopped x 9 yrs (within 15 yrs)   To calculate your smoking history, I need an accurate estimate of how many packs of cigarettes you smoked per day and for how many years. (Not just the number of PPD you are now smoking)   Years smoking 48 x Packs per day 1/4-1/2 = Pack years 20   (at least 20 pack yrs)   (Make sure they understand that we need to know how much they have smoked in the past, not just the number of PPD they are smoking now)  Do you have a personal history of cancer?  No    Do you have a family history of cancer? Yes  (cancer type and and relative) sister (uterine)  Are you coughing up blood?  No  Have you had unexplained weight loss of 15 lbs or more in the last 6 months? No  It looks like you meet all criteria.     Additional information: N/A

## 2024-11-14 ENCOUNTER — Ambulatory Visit (INDEPENDENT_AMBULATORY_CARE_PROVIDER_SITE_OTHER): Admitting: *Deleted

## 2024-11-14 ENCOUNTER — Encounter: Payer: Self-pay | Admitting: *Deleted

## 2024-11-14 DIAGNOSIS — F17210 Nicotine dependence, cigarettes, uncomplicated: Secondary | ICD-10-CM

## 2024-11-14 NOTE — Patient Instructions (Signed)

## 2024-11-14 NOTE — Progress Notes (Signed)
 Virtual Visit via Telephone Note  I connected with Heron DELENA Hamilton on 11/14/2024 at  8:00 AM EST by telephone and verified that I am speaking with the correct person using two identifiers.  Location: Patient: at home Provider: [address], [place], [place], [address]    I discussed the limitations, risks, security and privacy concerns of performing an evaluation and management service by telephone and the availability of in person appointments. I also discussed with the patient that there may be a patient responsible charge related to this service. The patient expressed understanding and agreed to proceed.   Shared Decision Making Visit Lung Cancer Screening Program [id number])   Eligibility: Age 29 y.o. Pack Years Smoking History Calculation 25 (# packs/per year x # years smoked) Recent History of coughing up blood  no Unexplained weight loss? no ( >Than 15 pounds within the last 6 months ) Prior History Lung / other cancer no (Diagnosis within the last 5 years already requiring surveillance chest CT Scans). Smoking Status Current Smoker Former Smokers: Years since quit: n/a  Quit Date: n/a  Visit Components: Discussion included one or more decision making aids. yes Discussion included risk/benefits of screening. yes Discussion included potential follow up diagnostic testing for abnormal scans. yes Discussion included meaning and risk of over diagnosis. yes Discussion included meaning and risk of False Positives. yes Discussion included meaning of total radiation exposure. yes  Counseling Included: Importance of adherence to annual lung cancer LDCT screening. yes Impact of comorbidities on ability to participate in the program. yes Ability and willingness to under diagnostic treatment. yes  Smoking Cessation Counseling: Current Smokers:  Discussed importance of smoking cessation. yes Information about tobacco cessation classes and interventions provided to patient.  yes Patient provided with ticket for LDCT Scan. no Symptomatic Patient. no  Counseling(Intermediate counseling: > three minutes) [address]406 Diagnosis Code: Tobacco Use Z72.0 Asymptomatic Patient yes  Smoking/Tobacco Cessation Counseling KIMBERLYE DILGER is a current user of tobacco or nicotine products. She is not ready to quit at this time. Counseling provided today addressed the risks of continued use and the benefits of cessation. Discussed tobacco/nicotine use history, readiness to quit, and evidence-based treatment options including behavioral strategies, support resources, and pharmacologic therapies. Provided encouragement and educational materials on steps and resources to quit smoking. Patient questions were addressed, and follow-up recommended for continued support. Total time spent on counseling: 4 minutes.   Former Smokers:  Discussed the importance of maintaining cigarette abstinence. yes Diagnosis Code: Personal History of Nicotine Dependence. S12.108 Information about tobacco cessation classes and interventions provided to patient. Yes Patient provided with ticket for LDCT Scan. no Written Order for Lung Cancer Screening with LDCT placed in Epic. Yes (CT Chest Lung Cancer Screening Low Dose W/O CM) [id number] Z12.2-Screening of respiratory organs Z87.891-Personal history of nicotine dependence   Laneta Speaks, RN

## 2024-11-15 ENCOUNTER — Ambulatory Visit
Admission: RE | Admit: 2024-11-15 | Discharge: 2024-11-15 | Disposition: A | Discharge status: Home / Self Care | Source: Ambulatory Visit | Attending: Acute Care | Admitting: Acute Care

## 2024-11-15 DIAGNOSIS — Z122 Encounter for screening for malignant neoplasm of respiratory organs: Secondary | ICD-10-CM | POA: Diagnosis present

## 2024-11-15 DIAGNOSIS — F17210 Nicotine dependence, cigarettes, uncomplicated: Secondary | ICD-10-CM | POA: Insufficient documentation

## 2024-11-15 DIAGNOSIS — Z87891 Personal history of nicotine dependence: Secondary | ICD-10-CM | POA: Diagnosis present

## 2024-11-23 ENCOUNTER — Other Ambulatory Visit: Payer: Self-pay

## 2024-11-23 DIAGNOSIS — F17210 Nicotine dependence, cigarettes, uncomplicated: Secondary | ICD-10-CM

## 2024-11-23 DIAGNOSIS — Z87891 Personal history of nicotine dependence: Secondary | ICD-10-CM

## 2024-11-23 DIAGNOSIS — Z122 Encounter for screening for malignant neoplasm of respiratory organs: Secondary | ICD-10-CM

## 2024-11-25 ENCOUNTER — Encounter: Payer: Self-pay | Admitting: Nurse Practitioner

## 2024-12-01 ENCOUNTER — Ambulatory Visit

## 2024-12-01 VITALS — Ht 62.0 in | Wt 180.0 lb

## 2024-12-01 DIAGNOSIS — Z0000 Encounter for general adult medical examination without abnormal findings: Secondary | ICD-10-CM | POA: Diagnosis not present

## 2024-12-01 NOTE — Patient Instructions (Signed)
 Ms. Slivka,  Thank you for taking the time for your Medicare Wellness Visit. I appreciate your continued commitment to your health goals. Please review the care plan we discussed, and feel free to reach out if I can assist you further.  Please note that Annual Wellness Visits do not include a physical exam. Some assessments may be limited, especially if the visit was conducted virtually. If needed, we may recommend an in-person follow-up with your provider.  Ongoing Care Seeing your primary care provider every 3 to 6 months helps us  monitor your health and provide consistent, personalized care.   Referrals If a referral was made during today's visit and you haven't received any updates within two weeks, please contact the referred provider directly to check on the status.  Recommended Screenings:  You will be due for a colonoscopy around October 2026. Have your MMG and bone density test as scheduled on 12/19/24. Keep up the good work!   Health Maintenance  Topic Date Due   Osteoporosis screening with Bone Density Scan  Never done   Medicare Annual Wellness Visit  08/09/2022   Breast Cancer Screening  11/06/2022   COVID-19 Vaccine (4 - 2025-26 season) 07/11/2024   Colon Cancer Screening  08/25/2025   Screening for Lung Cancer  11/15/2025   DTaP/Tdap/Td vaccine (2 - Td or Tdap) 07/04/2027   Pneumococcal Vaccine for age over 42  Completed   Flu Shot  Completed   Hepatitis C Screening  Completed   Zoster (Shingles) Vaccine  Completed   Meningitis B Vaccine  Aged Out       12/01/2024   11:21 AM  Advanced Directives  Does Patient Have a Medical Advance Directive? No  Would patient like information on creating a medical advance directive? Yes (MAU/Ambulatory/Procedural Areas - Information given)    Vision: Annual vision screenings are recommended for early detection of glaucoma, cataracts, and diabetic retinopathy. These exams can also reveal signs of chronic conditions such as  diabetes and high blood pressure.  Dental: Annual dental screenings help detect early signs of oral cancer, gum disease, and other conditions linked to overall health, including heart disease and diabetes.  Please see the attached documents for additional preventive care recommendations.

## 2024-12-01 NOTE — Progress Notes (Signed)
 "  Chief Complaint  Patient presents with   Medicare Wellness     Subjective:   Carmen Buckley is a 74 y.o. female who presents for a [employer] Visit.  Visit info / Clinical Intake: Medicare Wellness Visit Type:: Subsequent Annual Wellness Visit Persons participating in visit and providing information:: patient Medicare Wellness Visit Mode:: Telephone If telephone:: video declined Since this visit was completed virtually, some vitals may be partially provided or unavailable. Missing vitals are due to the limitations of the virtual format.: Documented vitals are patient reported If Telephone or Video please confirm:: I connected with patient using audio/video enable telemedicine. I verified patient identity with two identifiers, discussed telehealth limitations, and patient agreed to proceed. Patient Location:: home Provider Location:: clinic office Interpreter Needed?: No Pre-visit prep was completed: yes AWV questionnaire completed by patient prior to visit?: no Living arrangements:: (!) lives alone Patient's Overall Health Status Rating: very good Typical amount of pain: some Does pain affect daily life?: no Are you currently prescribed opioids?: no  Dietary Habits and Nutritional Risks How many meals a day?: 2 Eats fruit and vegetables daily?: yes Most meals are obtained by: preparing own meals In the last 2 weeks, have you had any of the following?: none Diabetic:: no  Functional Status Activities of Daily Living (to include ambulation/medication): Independent Ambulation: Independent with device- listed below Home Assistive Devices/Equipment: Eyeglasses; Shower/tub chair Medication Administration: Independent Home Management (perform basic housework or laundry): Independent Manage your own finances?: yes Primary transportation is: family / friends; driving Concerns about vision?: no *vision screening is required for WTM* Concerns about hearing?:  no  Fall Screening Falls in the past year?: 0 Number of falls in past year: 0 Was there an injury with Fall?: 0 Fall Risk Category Calculator: 0 Patient Fall Risk Level: Low Fall Risk  Fall Risk Patient at Risk for Falls Due to: No Fall Risks Fall risk Follow up: Falls evaluation completed  Home and Transportation Safety: All rugs have non-skid backing?: yes All stairs or steps have railings?: yes Grab bars in the bathtub or shower?: yes Have non-skid surface in bathtub or shower?: yes Good home lighting?: yes Regular seat belt use?: yes Hospital stays in the last year:: no  Cognitive Assessment Difficulty concentrating, remembering, or making decisions? : no Will 6CIT or Mini Cog be Completed: yes What year is it?: 0 points What month is it?: 0 points Give patient an address phrase to remember (5 components): [address] [place] About what time is it?: 0 points Count backwards from 20 to 1: 0 points Say the months of the year in reverse: 0 points Repeat the address phrase from earlier: 4 points 6 CIT Score: 4 points  Advance Directives (For Healthcare) Does Patient Have a Medical Advance Directive?: No Would patient like information on creating a medical advance directive?: Yes (MAU/Ambulatory/Procedural Areas - Information given)  Reviewed/Updated  Reviewed/Updated: Reviewed All (Medical, Surgical, Family, Medications, Allergies, Care Teams, Patient Goals)    Allergies (verified) Patient has no known allergies.   Current Medications (verified) Outpatient Encounter Medications as of 12/01/2024  Medication Sig   atorvastatin  (LIPITOR) 10 MG tablet Take 1 tablet (10 mg total) by mouth daily.   lisinopril -hydrochlorothiazide  (ZESTORETIC ) 20-25 MG tablet Take 1 tablet by mouth daily. 40/25 MG HS   loratadine  (CLARITIN ) 10 MG tablet Take 1 tablet (10 mg total) by mouth daily.   metoprolol  tartrate (LOPRESSOR ) 50 MG tablet Take 1 tablet (50 mg total) by mouth  daily.    Multiple Vitamins-Minerals (PRESERVISION AREDS PO) Take 1 tablet by mouth 2 (two) times daily.   omeprazole  (PRILOSEC) 20 MG capsule Take 1 capsule (20 mg total) by mouth daily.   ondansetron  (ZOFRAN -ODT) 4 MG disintegrating tablet Take 1 tablet (4 mg total) by mouth every 8 (eight) hours as needed for nausea or vomiting.   No facility-administered encounter medications on file as of 12/01/2024.    History: Past Medical History:  Diagnosis Date   Anxiety    Depression    GERD (gastroesophageal reflux disease)    Hyperlipidemia    Hypertension    Past Surgical History:  Procedure Laterality Date   ACHILLES TENDON REPAIR Left    CATARACT EXTRACTION W/PHACO Right 05/26/2017   Procedure: CATARACT EXTRACTION PHACO AND INTRAOCULAR LENS PLACEMENT (IOC);  Surgeon: Jaye Fallow, MD;  Location: [facility] ORS;  Service: Ophthalmology;  Laterality: Right;  US  00:32 AP% 14.3 CDE 4.64 Fluid pack lot # [id number]   COLONOSCOPY WITH PROPOFOL  N/A 08/25/2022   Procedure: COLONOSCOPY WITH PROPOFOL ;  Surgeon: Unk Corinn Skiff, MD;  Location: [facility];  Service: Gastroenterology;  Laterality: N/A;   TENDON REPAIR Left    ARM   TOTAL ABDOMINAL HYSTERECTOMY W/ BILATERAL SALPINGOOPHORECTOMY     Family History  Problem Relation Age of Onset   Diabetes Mother    Heart disease Mother    Pneumonia Father    Cancer Sister    Uterine cancer Sister    Diabetes Sister    Heart disease Brother    Cancer Son    Social History   Occupational History   Not on file  Tobacco Use   Smoking status: Every Day    Current packs/day: 0.50    Average packs/day: 0.5 packs/day for 57.1 years (28.5 ttl pk-yrs)    Types: Cigarettes    Start date: 1969   Smokeless tobacco: Never   Tobacco comments:    Per pt, quit smoking x 9 yrs then started back- NS  Vaping Use   Vaping status: Never Used  Substance and Sexual Activity   Alcohol use: No   Drug use: No   Sexual activity: Not Currently   Tobacco  Counseling Ready to quit: Not Answered Counseling given: Not Answered Tobacco comments: Per pt, quit smoking x 9 yrs then started back- NS  SDOH Screenings   Food Insecurity: No Food Insecurity (12/01/2024)  Housing: Low Risk (12/01/2024)  Transportation Needs: No Transportation Needs (12/01/2024)  Utilities: Not At Risk (12/01/2024)  Alcohol Screen: Low Risk (10/20/2024)  Depression (PHQ2-9): Medium Risk (12/01/2024)  Financial Resource Strain: Low Risk (10/20/2024)  Physical Activity: Insufficiently Active (12/01/2024)  Social Connections: Moderately Isolated (12/01/2024)  Stress: No Stress Concern Present (12/01/2024)  Tobacco Use: High Risk (12/01/2024)  Health Literacy: Adequate Health Literacy (12/01/2024)   See flowsheets for full screening details  Depression Screen PHQ 2 & 9 Depression Scale- Over the past 2 weeks, how often have you been bothered by any of the following problems? Little interest or pleasure in doing things: 0 Feeling down, depressed, or hopeless (PHQ Adolescent also includes...irritable): 0 PHQ-2 Total Score: 0 Trouble falling or staying asleep, or sleeping too much: 3 Feeling tired or having little energy: 2 Poor appetite or overeating (PHQ Adolescent also includes...weight loss): 0 Feeling bad about yourself - or that you are a failure or have let yourself or your family down: 0 Trouble concentrating on things, such as reading the newspaper or watching television (PHQ Adolescent also includes...like school work): 0  Moving or speaking so slowly that other people could have noticed. Or the opposite - being so fidgety or restless that you have been moving around a lot more than usual: 0 Thoughts that you would be better off dead, or of hurting yourself in some way: 0 PHQ-9 Total Score: 5 If you checked off any problems, how difficult have these problems made it for you to do your work, take care of things at home, or get along with other people?: Somewhat  difficult  Depression Treatment Depression Interventions/Treatment : Patient refuses Treatment     Goals Addressed               This Visit's Progress     have more energy (pt-stated)               Objective:    Today's Vitals   12/01/24 1115  Weight: 180 lb (81.6 kg)  Height: 5' 2 (1.575 m)   Body mass index is 32.92 kg/m.  Hearing/Vision screen Hearing Screening - Comments:: Denies hearing loss  Vision Screening - Comments:: UTD w/ Dr. Jaye @ [facility] Immunizations and Health Maintenance Health Maintenance  Topic Date Due   Bone Density Scan  Never done   Mammogram  11/06/2022   COVID-19 Vaccine (4 - 2025-26 season) 07/11/2024   Colonoscopy  08/25/2025   Lung Cancer Screening  11/15/2025   Medicare Annual Wellness (AWV)  12/01/2025   DTaP/Tdap/Td (2 - Td or Tdap) 07/04/2027   Pneumococcal Vaccine: 50+ Years  Completed   Influenza Vaccine  Completed   Hepatitis C Screening  Completed   Zoster Vaccines- Shingrix  Completed   Meningococcal B Vaccine  Aged Out        Assessment/Plan:  This is a routine wellness examination for [place].  Patient Care Team: Cannady, Jolene T, NP as PCP - General (Nurse Practitioner) Jaye Fallow, MD as Referring Physician (Ophthalmology) Tobie Franky SQUIBB, DPM as Consulting Physician (Podiatry)  I have personally reviewed and noted the following in the patients chart:   Medical and social history Use of alcohol, tobacco or illicit drugs  Current medications and supplements including opioid prescriptions. Functional ability and status Nutritional status Physical activity Advanced directives List of other physicians Hospitalizations, surgeries, and ER visits in previous 12 months Vitals Screenings to include cognitive, depression, and falls Referrals and appointments  No orders of the defined types were placed in this encounter.  In addition, I have reviewed and discussed with patient  certain preventive protocols, quality metrics, and best practice recommendations. A written personalized care plan for preventive services as well as general preventive health recommendations were provided to patient.   Vina Ned, CMA   12/01/2024   Return in 1 year (on 12/05/2025) for Medicare Annual Wellness Visit.  After Visit Summary: (Mail) Due to this being a telephonic visit, the after visit summary with patients personalized plan was offered to patient via mail   Nurse Notes:  6 CIT Score - 4 Has MMG and DEXA scheduled 12/19/24 Declined Covid vaccine "

## 2024-12-14 ENCOUNTER — Telehealth: Payer: Self-pay

## 2024-12-14 NOTE — Telephone Encounter (Signed)
 Per pt can't drive to [place] . Pt requested to change location

## 2024-12-15 ENCOUNTER — Ambulatory Visit: Admitting: Gastroenterology

## 2024-12-19 ENCOUNTER — Other Ambulatory Visit

## 2024-12-19 ENCOUNTER — Encounter

## 2025-01-09 ENCOUNTER — Ambulatory Visit

## 2025-01-19 ENCOUNTER — Ambulatory Visit: Admitting: Nurse Practitioner

## 2025-01-23 ENCOUNTER — Ambulatory Visit: Admitting: Nurse Practitioner

## 2025-12-05 ENCOUNTER — Ambulatory Visit
